# Patient Record
Sex: Male | Born: 1955
Health system: Southern US, Community
[De-identification: ages and names within clinical notes are randomized; demographics above are authoritative.]

## PROBLEM LIST (undated history)

## (undated) DIAGNOSIS — M1990 Unspecified osteoarthritis, unspecified site: Secondary | ICD-10-CM

## (undated) DIAGNOSIS — I2510 Atherosclerotic heart disease of native coronary artery without angina pectoris: Secondary | ICD-10-CM

## (undated) DIAGNOSIS — N400 Enlarged prostate without lower urinary tract symptoms: Secondary | ICD-10-CM

## (undated) DIAGNOSIS — K219 Gastro-esophageal reflux disease without esophagitis: Secondary | ICD-10-CM

## (undated) HISTORY — PX: OTHER SURGICAL HISTORY: SHX169

---

## 1998-03-29 ENCOUNTER — Inpatient Hospital Stay (HOSPITAL_COMMUNITY): Admission: RE | Admit: 1998-03-29 | Discharge: 1998-03-31 | Payer: Self-pay | Admitting: General Practice

## 2019-05-15 ENCOUNTER — Other Ambulatory Visit: Payer: Self-pay

## 2019-05-15 ENCOUNTER — Ambulatory Visit: Payer: HRSA Program | Attending: Internal Medicine

## 2019-05-15 DIAGNOSIS — Z20822 Contact with and (suspected) exposure to covid-19: Secondary | ICD-10-CM

## 2019-05-15 DIAGNOSIS — Z20828 Contact with and (suspected) exposure to other viral communicable diseases: Secondary | ICD-10-CM | POA: Diagnosis not present

## 2019-05-16 NOTE — Progress Notes (Signed)
Order(s) created erroneously. Erroneous order ID: [id number]  Order moved by: Brigitte Pulse  Order move date/time: 05/16/2019 2:32 PM  Source Patient: [id number]  Source Contact: 05/15/2019  Destination Patient: [id number]  Destination Contact: 05/15/2019

## 2019-05-16 NOTE — Progress Notes (Signed)
Moving to orders only encounter.

## 2019-05-17 LAB — NOVEL CORONAVIRUS, NAA: SARS-CoV-2, NAA: NOT DETECTED

## 2019-08-30 ENCOUNTER — Ambulatory Visit: Payer: Self-pay | Attending: Internal Medicine

## 2019-08-30 DIAGNOSIS — Z23 Encounter for immunization: Secondary | ICD-10-CM

## 2019-08-30 NOTE — Progress Notes (Signed)
   Covid-19 Vaccination Clinic  Name:  BRENNDEN THAKORE    MRN: [id number] DOB: [birth year]  08/30/2019  Mr. Redlich was observed post Covid-19 immunization for 15 minutes without incident. He was provided with Vaccine Information Sheet and instruction to access the V-Safe system.   Mr. Anello was instructed to call 911 with any severe reactions post vaccine: Marland Kitchen Difficulty breathing  . Swelling of face and throat  . A fast heartbeat  . A bad rash all over body  . Dizziness and weakness   Immunizations Administered    Name Date Dose VIS Date Route   Pfizer COVID-19 Vaccine 08/30/2019  2:21 PM 0.3 mL 05/10/2019 Intramuscular   Manufacturer: [employer], [employer]   Lot: [id number]   [place]: [id number]

## 2019-09-24 ENCOUNTER — Ambulatory Visit: Payer: Self-pay | Attending: Internal Medicine

## 2019-09-24 DIAGNOSIS — Z23 Encounter for immunization: Secondary | ICD-10-CM

## 2019-09-24 NOTE — Progress Notes (Signed)
° °  Covid-19 Vaccination Clinic  Name:  Darius Andrade    MRN: [id number] DOB: 23-Jun-1955  09/24/2019  Mr. [name] was observed post Covid-19 immunization for 15 minutes without incident. He was provided with Vaccine Information Sheet and instruction to access the V-Safe system.   Mr. [name] was instructed to call 911 with any severe reactions post vaccine:  Difficulty breathing   Swelling of face and throat   A fast heartbeat   A bad rash all over body   Dizziness and weakness   Immunizations Administered    Name Date Dose VIS Date Route   Pfizer COVID-19 Vaccine 09/24/2019  4:39 PM 0.3 mL 07/24/2018 Intramuscular   Manufacturer: [place]   Lot: [id number]   [place]: [id number]

## 2020-02-18 ENCOUNTER — Encounter (HOSPITAL_BASED_OUTPATIENT_CLINIC_OR_DEPARTMENT_OTHER): Payer: Self-pay | Admitting: *Deleted

## 2020-02-18 ENCOUNTER — Emergency Department (HOSPITAL_BASED_OUTPATIENT_CLINIC_OR_DEPARTMENT_OTHER): Payer: Self-pay

## 2020-02-18 ENCOUNTER — Other Ambulatory Visit: Payer: Self-pay

## 2020-02-18 ENCOUNTER — Emergency Department (HOSPITAL_BASED_OUTPATIENT_CLINIC_OR_DEPARTMENT_OTHER)
Admission: EM | Admit: 2020-02-18 | Discharge: 2020-02-18 | Disposition: A | Discharge status: Home / Self Care | Payer: Self-pay | Attending: Emergency Medicine | Admitting: Emergency Medicine

## 2020-02-18 DIAGNOSIS — I82401 Acute embolism and thrombosis of unspecified deep veins of right lower extremity: Secondary | ICD-10-CM | POA: Insufficient documentation

## 2020-02-18 DIAGNOSIS — I82411 Acute embolism and thrombosis of right femoral vein: Secondary | ICD-10-CM

## 2020-02-18 LAB — CBC WITH DIFFERENTIAL/PLATELET
Abs Immature Granulocytes: 0.03 10*3/uL (ref 0.00–0.07)
Basophils Absolute: 0.1 10*3/uL (ref 0.0–0.1)
Basophils Relative: 1 %
Eosinophils Absolute: 0.1 10*3/uL (ref 0.0–0.5)
Eosinophils Relative: 1 %
HCT: 43.7 % (ref 39.0–52.0)
Hemoglobin: 15 g/dL (ref 13.0–17.0)
Immature Granulocytes: 0 %
Lymphocytes Relative: 12 %
Lymphs Abs: 1.2 10*3/uL (ref 0.7–4.0)
MCH: 31.4 pg (ref 26.0–34.0)
MCHC: 34.3 g/dL (ref 30.0–36.0)
MCV: 91.4 fL (ref 80.0–100.0)
Monocytes Absolute: 0.9 10*3/uL (ref 0.1–1.0)
Monocytes Relative: 10 %
Neutro Abs: 7.2 10*3/uL (ref 1.7–7.7)
Neutrophils Relative %: 76 %
Platelets: 229 10*3/uL (ref 150–400)
RBC: 4.78 MIL/uL (ref 4.22–5.81)
RDW: 11.5 % (ref 11.5–15.5)
WBC: 9.4 10*3/uL (ref 4.0–10.5)
nRBC: 0 % (ref 0.0–0.2)

## 2020-02-18 LAB — BASIC METABOLIC PANEL
Anion gap: 9 (ref 5–15)
BUN: 13 mg/dL (ref 8–23)
CO2: 26 mmol/L (ref 22–32)
Calcium: 9.2 mg/dL (ref 8.9–10.3)
Chloride: 105 mmol/L (ref 98–111)
Creatinine, Ser: 1.05 mg/dL (ref 0.61–1.24)
GFR calc Af Amer: >60 mL/min (ref >60)
GFR calc non Af Amer: >60 mL/min (ref >60)
Glucose, Bld: 102 mg/dL — ABNORMAL HIGH (ref 70–99)
Potassium: 4.1 mmol/L (ref 3.5–5.1)
Sodium: 140 mmol/L (ref 135–145)

## 2020-02-18 MED ORDER — RIVAROXABAN (XARELTO) EDUCATION KIT FOR DVT/PE PATIENTS: PACK | Freq: Once | Status: AC

## 2020-02-18 MED ORDER — RIVAROXABAN 15 MG PO TABS: 15.0000 mg | ORAL_TABLET | Freq: Once | ORAL | Status: AC

## 2020-02-18 MED ORDER — RIVAROXABAN (XARELTO) VTE STARTER PACK (15 & 20 MG): ORAL_TABLET | ORAL | 0 refills | Status: DC

## 2020-02-18 MED ORDER — RIVAROXABAN 15 MG PO TABS
ORAL_TABLET | ORAL | Status: AC
  Administered 2020-02-18: 15 mg via ORAL
  Filled 2020-02-18: qty 1

## 2020-02-18 NOTE — Discharge Instructions (Signed)
Contact a health care provider if: You miss a dose of your blood thinner. Your menstrual period is heavier than usual. You have unusual bruising. Get help right away if: You have: New or increased pain, swelling, or redness in an arm or leg. Numbness or tingling in an arm or leg. Shortness of breath. Chest pain. A rapid or irregular heartbeat. A severe headache or confusion. A cut that will not stop bleeding. There is blood in your vomit, stool, or urine. You have a serious fall or accident, or you hit your head. You feel light-headed or dizzy. You cough up blood.

## 2020-02-18 NOTE — ED Notes (Signed)
Sent blue top to lab also, if needed

## 2020-02-18 NOTE — ED Notes (Signed)
Pt ambulatory, denies CP, denies shob

## 2020-02-18 NOTE — ED Notes (Signed)
ED Provider at bedside. 

## 2020-02-18 NOTE — ED Triage Notes (Signed)
Pain and swelling to his right lower leg x 2 weeks. It gets worse with standing on his feet at work.

## 2020-02-18 NOTE — ED Provider Notes (Signed)
[facility] Provider Note   CSN: [id number] Arrival date & time: 02/18/20  1340     History Chief Complaint  Patient presents with  . Leg Pain    Darius Andrade is a 64 y.o. male who presents emergency department chief complaint of right leg swelling. Onset 2 weeks ago. Patient states that his leg swelled spontaneously. He does stand on his legs all day and works at a [employer]. The patient states that his leg is felt heavy and at night is very achy. It seems to improve when he lies down. His right leg is the only one that is swollen. Is never had anything like this before. The patient was at work today looking at his leg when a coworker noticed how swollen his leg with and told him that he should get it checked out because it could be a blood clot and if it dislodged it could go to his lung and kill him. The patient denies any chest pain or shortness of breath. He denies soaking night sweats, fevers, unexplained weight loss. He has no history of known coagulopathies.  HPI     History reviewed. No pertinent past medical history.  There are no problems to display for this patient.   History reviewed. No pertinent surgical history.     No family history on file.  Social History   Tobacco Use  . Smoking status: Never Smoker  . Smokeless tobacco: Never Used  Substance Use Topics  . Alcohol use: Never  . Drug use: Never    Home Medications Prior to Admission medications   Medication Sig Start Date End Date Taking? Authorizing Provider  tamsulosin (FLOMAX) 0.4 MG CAPS capsule Take by mouth.   Yes [provider]    Allergies    Patient has no known allergies.  Review of Systems   Review of Systems Ten systems reviewed and are negative for acute change, except as noted in the HPI.   Physical Exam Updated Vital Signs BP (!) 141/70 (BP Location: Left Arm)   Pulse (!) 56   Temp 98.7 F (37.1 C) (Oral)   Resp 18   Ht 5\' 10"   (1.778 m)   Wt 74.4 kg   SpO2 100%   BMI 23.53 kg/m   Physical Exam Physical Exam  Nursing note and vitals reviewed. Constitutional: He appears well-developed and well-nourished. No distress.  HENT:  Head: Normocephalic and atraumatic.  Eyes: Conjunctivae normal are normal. No scleral icterus.  Neck: Normal range of motion. Neck supple.  Cardiovascular: Normal rate, regular rhythm and normal heart sounds.   Pulmonary/Chest: Effort normal and breath sounds normal. No respiratory distress.  Abdominal: Soft. There is no tenderness.  Musculoskeletal: Right leg is markedly more swollen especially in the calf on the right side. There is some petechiae on the medial side of the leg. No excoriations noted. Mild venous distention noted. Neurological: He is alert.  Skin: Skin is warm and dry. He is not diaphoretic.  Psychiatric: His behavior is normal.    ED Results / Procedures / Treatments   Labs (all labs ordered are listed, but only abnormal results are displayed) Labs Reviewed - No data to display  EKG None  Radiology US Venous Img Lower Unilateral Right  Result Date: 02/18/2020 CLINICAL DATA:  Right lower extremity swelling and pain 3 days. EXAM: RIGHT LOWER EXTREMITY VENOUS DOPPLER ULTRASOUND TECHNIQUE: Gray-scale sonography with compression, as well as color and duplex ultrasound, were performed to evaluate the  deep venous system(s) from the level of the common femoral vein through the popliteal and proximal calf veins. COMPARISON:  None. FINDINGS: VENOUS Normal compressibility of the common femoral, superficial femoral veins. There is heterogeneous echogenic material throughout the profundal femoral vein which is noncompressible and demonstrates no color flow. These changes extend into the popliteal and peroneal/posterior tibial veins below the knee. Findings are compatible with acute occlusive thrombus. Limited views of the contralateral common femoral vein are unremarkable. OTHER  None. Limitations: none IMPRESSION: Acute occlusive DVT involving the right profundal femoral vein extending into the popliteal and calf veins below the knee. These results were called by telephone at the time of interpretation on 02/18/2020 at 2:39 pm to provider [facility] , who verbally acknowledged these results. Electronically Signed   By: Darius Andrade M.D.   On: 02/18/2020 14:40    Procedures Procedures (including critical care time)  Medications Ordered in ED Medications - No data to display  ED Course  I have reviewed the triage vital signs and the nursing notes.  Pertinent labs & imaging results that were available during my care of the patient were reviewed by me and considered in my medical decision making (see chart for details).    MDM Rules/Calculators/A&P                          Is a 64 year old male here with right leg swelling. I ordered and reviewed a ultrasound of the right leg which shows an acute DVT. The patient does not have any chest pain or shortness of breath to suggest pulmonary embolus. Patient has multiple social concerns. I had a long discussion with him about the need for anticoagulation and follow-up. I have printed out the [place] care path coordinator information to help the patient follow-up on payment options and help with cost. The patient was at first reluctant to allow me to get labs due to cost however patient understands that these are necessary at this time. He does not wish to be admitted. The patient will be discharged on Xarelto. Given 15 mg here. [facility]. Given the Xarelto starter pack. I reviewed the patient's labs which show no acute abnormalities. He has normal renal function. He appears appropriate for discharge at this time with strict return precautions. Final Clinical Impression(s) / ED Diagnoses Final diagnoses:  None    Rx / DC Orders ED Discharge Orders    None       Darius Mail, PA-C 02/18/20 1908    Darius Hidden, MD 02/18/20 [phone]

## 2020-02-18 NOTE — ED Notes (Signed)
Pt ambulated to RR unassisted 

## 2020-02-18 NOTE — ED Notes (Signed)
Pt discharged to home. Discharge instructions have been discussed with patient and/or family members. Pt verbally acknowledges understanding d/c instructions, and endorses comprehension to checkout at registration before leaving.  °

## 2020-02-19 ENCOUNTER — Telehealth (HOSPITAL_BASED_OUTPATIENT_CLINIC_OR_DEPARTMENT_OTHER): Payer: Self-pay | Admitting: Emergency Medicine

## 2020-08-20 ENCOUNTER — Other Ambulatory Visit: Payer: Self-pay | Admitting: Gastroenterology

## 2020-08-20 DIAGNOSIS — K635 Polyp of colon: Secondary | ICD-10-CM

## 2020-08-20 DIAGNOSIS — Q279 Congenital malformation of peripheral vascular system, unspecified: Secondary | ICD-10-CM

## 2020-08-26 ENCOUNTER — Other Ambulatory Visit: Payer: Self-pay | Admitting: Surgery

## 2020-08-26 DIAGNOSIS — C189 Malignant neoplasm of colon, unspecified: Secondary | ICD-10-CM

## 2020-09-04 ENCOUNTER — Other Ambulatory Visit: Payer: Self-pay

## 2020-09-04 ENCOUNTER — Ambulatory Visit
Admission: RE | Admit: 2020-09-04 | Discharge: 2020-09-04 | Disposition: A | Discharge status: Home / Self Care | Payer: 59 | Source: Ambulatory Visit | Attending: Gastroenterology | Admitting: Gastroenterology

## 2020-09-04 ENCOUNTER — Ambulatory Visit
Admission: RE | Admit: 2020-09-04 | Discharge: 2020-09-04 | Disposition: A | Discharge status: Home / Self Care | Payer: 59 | Source: Ambulatory Visit | Attending: Surgery | Admitting: Surgery

## 2020-09-04 DIAGNOSIS — C189 Malignant neoplasm of colon, unspecified: Secondary | ICD-10-CM

## 2020-09-04 DIAGNOSIS — Q279 Congenital malformation of peripheral vascular system, unspecified: Secondary | ICD-10-CM

## 2020-09-04 DIAGNOSIS — K635 Polyp of colon: Secondary | ICD-10-CM

## 2020-09-04 MED ORDER — IOPAMIDOL (ISOVUE-300) INJECTION 61%
100.0000 mL | Freq: Once | INTRAVENOUS | Status: AC | PRN
  Administered 2020-09-04: 100 mL via INTRAVENOUS

## 2020-10-20 ENCOUNTER — Ambulatory Visit: Payer: Self-pay | Admitting: Surgery

## 2020-10-20 NOTE — Progress Notes (Signed)
Please place orders in epic pt. Is scheduled for preop 

## 2020-10-20 NOTE — H&P (Signed)
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 Paulita Fujita 08/07/20 which showed: Large polyp in Darius descending colon suspicious in appearance but biopsied and returned tubulovillous adenoma. Additionally, ascending colon and transverse colon polyps that were removed and returned as tubular adenomas. Darius descending colon lesion was tattooed. He was also found to have some mucosal nodule in Darius rectum and sigmoid as well as in Darius descending and transverse colon that looked like a venous bleb.  He was referred to our office for further evaluation. He is scheduled for a CT abdomen and pelvis 09/04/20. We are adding a CT chest. He denies any complaints today. He denies any abdominal pain, rectal bleeding, nausea or vomiting.  PMH: DVT, BPH  PSH: He denies any prior abdominal surgical history  FHx: Maternal Aunt with colon cancer; denies any other FHx of colorectal, breast, endometrial, ovarian or cervical cancer.  Social: Denies use of tobacco (quit 2005)/EtOH/drugs; works in Facilities [employer]  ROS: A comprehensive 10 system review of systems was completed with Darius Andrade and pertinent findings as noted above.  Darius Andrade is a 65 year old male.   Past Surgical History Darius Forehand, Andrade; 08/26/2020 3:17 PM) Colon Polyp Removal - Colonoscopy  Hemorrhoidectomy   Diagnostic Studies History Darius Forehand, Andrade; 08/26/2020 3:17 PM) Colonoscopy  within last year  Allergies Darius Forehand, Andrade; 08/26/2020 3:17 PM) No Known Drug Allergies  [08/26/2020]: Allergies Reconciled   Medication History Darius Forehand, Andrade; 08/26/2020 3:17 PM) PEG 3350-KCl-Na Bicarb-NaCl (420GM For Solution, Oral) Active. Tamsulosin HCl (0.4MG  Capsule, Oral) Active. Xarelto (20MG  Tablet, Oral) Active. Xarelto Starter Pack  (15 & 20MG  Tab Ther Pack, Oral) Active. Medications Reconciled  Social History Darius Forehand, Andrade; 08/26/2020 3:17 PM) Alcohol use  Remotely quit alcohol use. Caffeine use  Coffee. Tobacco use  Former smoker.  Family History Darius Forehand, Andrade; 08/26/2020 3:17 PM) Arthritis  Father, Mother. Diabetes Mellitus  Brother, Father. Melanoma  Brother. Prostate Cancer  Father.  Other Problems Darius Forehand, Andrade; 08/26/2020 3:17 PM) Enlarged Prostate  Gastroesophageal Reflux Disease     Review of Systems Darius Andrade; 08/26/2020 3:17 PM) General Not Present- Appetite Loss, Chills, Fatigue, Fever, Night Sweats, Weight Gain and Weight Loss. Skin Present- New Lesions. Not Present- Change in Wart/Mole, Dryness, Hives, Jaundice, Non-Healing Wounds, Rash and Ulcer. HEENT Present- Wears glasses/contact lenses. Not Present- Earache, Hearing Loss, Hoarseness, Nose Bleed, Oral Ulcers, Ringing in Darius Ears, Seasonal Allergies, Sinus Pain, Sore Throat, Visual Disturbances and Yellow Eyes. Respiratory Not Present- Bloody sputum, Chronic Cough, Difficulty Breathing, Snoring and Wheezing. Breast Not Present- Breast Mass, Breast Pain, Nipple Discharge and Skin Changes. Cardiovascular Present- Leg Cramps. Not Present- Chest Pain, Difficulty Breathing Lying Down, Palpitations, Rapid Heart Rate, Shortness of Breath and Swelling of Extremities. Gastrointestinal Present- Change in Bowel Habits, Constipation and Hemorrhoids. Not Present- Abdominal Pain, Bloating, Bloody Stool, Chronic diarrhea, Difficulty Swallowing, Excessive gas, Gets full quickly at meals, Indigestion, Nausea, Rectal Pain and Vomiting. Male Genitourinary Present- Nocturia. Not Present- Blood in Urine, Change in Urinary Stream, Frequency, Impotence, Painful Urination, Urgency and Urine Leakage. Musculoskeletal Present- Muscle Weakness. Not Present- Back Pain, Joint Pain, Joint Stiffness, Muscle Pain and Swelling of  Extremities. Neurological Present- Decreased Memory. Not Present- Fainting, Headaches, Numbness, Seizures, Tingling, Tremor, Trouble walking and Weakness. Psychiatric Present- Depression. Not Present- Anxiety, Bipolar, Change in Sleep Pattern,  Fearful and Frequent crying. Endocrine Not Present- Cold Intolerance, Excessive Hunger, Hair Changes, Heat Intolerance, Hot flashes and New Diabetes. Hematology Present- Blood Thinners and Easy Bruising. Not Present- Excessive bleeding, Gland problems, HIV and Persistent Infections.  Vitals Darius Reams Alston Andrade; 08/26/2020 3:18 PM) 08/26/2020 3:17 PM Weight: 169.25 lb Height: 65in Body Surface Area: 1.84 m Body Mass Index: 28.16 kg/m  Temp.: 97.75F  Pulse: 98 (Regular)  P.OX: 97% (Room air) BP: 126/80(Sitting, Left Arm, Standard)       Physical Exam Darius Gave M. Halimah Bewick MD; 08/26/2020 4:10 PM) Darius physical exam findings are as follows: Note: Constitutional: No acute distress; conversant; no deformities; wearing mask Eyes: Moist conjunctiva; no lid lag; anicteric sclerae; pupils equal and round Neck: Trachea midline; no palpable thyromegaly Lungs: Normal respiratory effort; no tactile fremitus CV: rrr; no palpable thrill; no pitting edema GI: Abdomen soft, nontender, nondistended; no palpable hepatosplenomegaly MSK: Normal gait; no clubbing/cyanosis Psychiatric: Appropriate affect; alert and oriented 3 Lymphatic: No palpable cervical or axillary lymphadenopathy    Assessment & Plan Darius Gave M. Que Meneely MD; 08/26/2020 4:14 PM) COLON CANCER (C18.9)  Darius Andrade is a very pleasant 45yoM with newly diagnosed polypoid lesion in descending colon, not amenable to endoscopic removal; biopsy showed TVA but endoscopic appearance was worrisome for potential malignancy; tattoo'd  CT CAP 09/07/20 - no evidence of metastatic disease  -Medical clearance and plan for anticoagulation perioperatively - would need to come off ~72hrs prior to  surgery -Darius anatomy and physiology of Darius GI tract was discussed with Darius Andrade. Darius pathophysiology of colon polyps and cancer was discussed with associated pictures. -We reviewed robotic assisted left hemicolectomy/segmental colectomy based on location of tattoo; flexible sigmoidoscopy -Darius planned procedure, material risks (including, but not limited to, pain, bleeding, infection, scarring, need for blood transfusion, damage to surrounding structures- blood vessels/nerves/viscus/organs, damage to ureter, urine leak, leak from anastomosis, need for additional procedures, worsening of pre-existing medical conditions, need for stoma which may be permanent, hernia, recurrence, DVT/PE, pneumonia, heart attack, stroke, death) benefits and alternatives to surgery were discussed at length. Darius Andrade's questions were answered to his satisfaction, he voiced understanding and elected to proceed with surgery. Additionally, we discussed typical postoperative expectations and Darius recovery process.

## 2020-10-20 NOTE — Patient Instructions (Addendum)
DUE TO COVID-19 ONLY ONE VISITOR IS ALLOWED TO COME WITH YOU AND STAY IN THE WAITING ROOM ONLY DURING PRE OP AND PROCEDURE DAY OF SURGERY.   TWO VISITORS  MAY VISIT WITH YOU AFTER SURGERY IN YOUR PRIVATE ROOM DURING VISITING HOURS ONLY!  YOU NEED TO HAVE A COVID 19 TEST ON__5-31-22_____ @_______ , THIS TEST MUST BE DONE BEFORE SURGERY,  COVID TESTING SITE [address] [place] [address], IT IS ON THE RIGHT GOING OUT [address] APPROXIMATELY  2 MINUTES PAST [employer] ON THE RIGHT. ONCE YOUR COVID TEST IS COMPLETED,  PLEASE BEGIN THE QUARANTINE INSTRUCTIONS AS OUTLINED IN YOUR HANDOUT.                Darius Andrade  10/20/2020   Your procedure is scheduled on: 10-28-20   Report to [facility] Main  Entrance   Report to admitting at       [place] AM     Call this number if you have problems the morning of surgery [phone]    Remember: Follow bowel prep per MD order follow a clear liquid dit the day of your bowel prep to prevent dehydration   DRINK 2 PRESURGERY ENSURE DRINKS THE NIGHT BEFORE SURGERY AT   1000 PM AND 1 PRESURGERY DRINK THE DAY OF THE PROCEDURE 3 HOURS PRIOR TO SCHEDULED SURGERY. NO SOLIDS AFTER MIDNIGHT   THE DAY PRIOR TO THE SURGERY. NOTHING BY MOUTH EXCEPT CLEAR LIQUIDS UNTIL THREE HOURS PRIOR TO SCHEDULED SURGERY.   PLEASE FINISH PRESURGERY ENSURE DRINK PER SURGEON ORDER 3 HOURS PRIOR TO SCHEDULED SURGERY TIME WHICH NEEDS TO BE   COMPLETED AT __0530 am then nothing by mouth_______.     CLEAR LIQUID DIET   Foods Allowed                                                                                       Foods Excluded  Black Coffee and tea, regular and decaf                                         liquids that you cannot  Plain Jell-O any favor except red or purple                                           see through such as: Fruit ices (not with fruit pulp)                                                              milk,  soups, orange juice  Iced Popsicles  All solid food                                    Cranberry, grape and apple juices Sports drinks like Gatorade Lightly seasoned clear broth or consume(fat free) Sugar, honey syrup  Sample Menu Breakfast                                Lunch                                     Supper Cranberry juice                    Beef broth                            Chicken broth Jell-O                                     Grape juice                           Apple juice Coffee or tea                        Jell-O                                      Popsicle                                                Coffee or tea                        Coffee or tea  _____________________________________________________________________     BRUSH YOUR TEETH MORNING OF SURGERY AND RINSE YOUR MOUTH OUT, NO CHEWING GUM CANDY OR MINTS.     Take these medicines the morning of surgery with A SIP OF WATER: flomax                                 You may not have any metal on your body including hair pins and              piercings  Do not wear jewelry,  lotions, powders or perfumes, deodorant                       Men may shave face and neck.   Do not bring valuables to the hospital. [place].  Contacts, dentures or bridgework may not be worn into surgery.      Patients discharged the day of surgery will not be allowed to drive home. IF YOU ARE HAVING SURGERY AND GOING HOME THE SAME DAY, YOU MUST  HAVE AN ADULT TO DRIVE YOU HOME AND BE WITH YOU FOR 24 HOURS. YOU MAY GO HOME BY TAXI OR UBER OR ORTHERWISE, BUT AN ADULT MUST ACCOMPANY YOU HOME AND STAY WITH YOU FOR 24 HOURS.  Name and phone number of your driver:  Special Instructions: N/A              Please read over the following fact sheets you were  given: _____________________________________________________________________             [facility] - Preparing for Surgery Before surgery, you can play an important role.  Because skin is not sterile, your skin needs to be as free of germs as possible.  You can reduce the number of germs on your skin by washing with CHG (chlorahexidine gluconate) soap before surgery.  CHG is an antiseptic cleaner which kills germs and bonds with the skin to continue killing germs even after washing. Please DO NOT use if you have an allergy to CHG or antibacterial soaps.  If your skin becomes reddened/irritated stop using the CHG and inform your nurse when you arrive at Short Stay. Do not shave (including legs and underarms) for at least 48 hours prior to the first CHG shower.  You may shave your face/neck. Please follow these instructions carefully:  1.  Shower with CHG Soap the night before surgery and the  morning of Surgery.  2.  If you choose to wash your hair, wash your hair first as usual with your  normal  shampoo.  3.  After you shampoo, rinse your hair and body thoroughly to remove the  shampoo.                           4.  Use CHG as you would any other liquid soap.  You can apply chg directly  to the skin and wash                       Gently with a scrungie or clean washcloth.  5.  Apply the CHG Soap to your body ONLY FROM THE NECK DOWN.   Do not use on face/ open                           Wound or open sores. Avoid contact with eyes, ears mouth and genitals (private parts).                       Wash face,  Genitals (private parts) with your normal soap.             6.  Wash thoroughly, paying special attention to the area where your surgery  will be performed.  7.  Thoroughly rinse your body with warm water from the neck down.  8.  DO NOT shower/wash with your normal soap after using and rinsing off  the CHG Soap.                9.  Pat yourself dry with a clean towel.            10.  Wear  clean pajamas.            11.  Place clean sheets on your bed the night of your first shower and do not  sleep with pets. Day of Surgery : Do not apply any lotions/deodorants the morning of surgery.  Please wear clean clothes to the hospital/surgery center.  FAILURE TO FOLLOW THESE INSTRUCTIONS MAY RESULT IN THE CANCELLATION OF YOUR SURGERY PATIENT SIGNATURE_________________________________  NURSE SIGNATURE__________________________________  ________________________________________________________________________   Darius Andrade  An incentive spirometer is a tool that can help keep your lungs clear and active. This tool measures how well you are filling your lungs with each breath. Taking long deep breaths may help reverse or decrease the chance of developing breathing (pulmonary) problems (especially infection) following:  A long period of time when you are unable to move or be active. BEFORE THE PROCEDURE   If the spirometer includes an indicator to show your best effort, your nurse or respiratory therapist will set it to a desired goal.  If possible, sit up straight or lean slightly forward. Try not to slouch.  Hold the incentive spirometer in an upright position. INSTRUCTIONS FOR USE  1. Sit on the edge of your bed if possible, or sit up as far as you can in bed or on a chair. 2. Hold the incentive spirometer in an upright position. 3. Breathe out normally. 4. Place the mouthpiece in your mouth and seal your lips tightly around it. 5. Breathe in slowly and as deeply as possible, raising the piston or the ball toward the top of the column. 6. Hold your breath for 3-5 seconds or for as long as possible. Allow the piston or ball to fall to the bottom of the column. 7. Remove the mouthpiece from your mouth and breathe out normally. 8. Rest for a few seconds and repeat Steps 1 through 7 at least 10 times every 1-2 hours when you are awake. Take your time and take a few normal  breaths between deep breaths. 9. The spirometer may include an indicator to show your best effort. Use the indicator as a goal to work toward during each repetition. 10. After each set of 10 deep breaths, practice coughing to be sure your lungs are clear. If you have an incision (the cut made at the time of surgery), support your incision when coughing by placing a pillow or rolled up towels firmly against it. Once you are able to get out of bed, walk around indoors and cough well. You may stop using the incentive spirometer when instructed by your caregiver.  RISKS AND COMPLICATIONS  Take your time so you do not get dizzy or light-headed.  If you are in pain, you may need to take or ask for pain medication before doing incentive spirometry. It is harder to take a deep breath if you are having pain. AFTER USE  Rest and breathe slowly and easily.  It can be helpful to keep track of a log of your progress. Your caregiver can provide you with a simple table to help with this. If you are using the spirometer at home, follow these instructions: [place] IF:   You are having difficultly using the spirometer.  You have trouble using the spirometer as often as instructed.  Your pain medication is not giving enough relief while using the spirometer.  You develop fever of 100.5 F (38.1 C) or higher. SEEK IMMEDIATE MEDICAL CARE IF:   You cough up bloody sputum that had not been present before.  You develop fever of 102 F (38.9 C) or greater.  You develop worsening pain at or near the incision site. MAKE SURE YOU:   Understand these instructions.  Will watch your condition.  Will get help right away if you are not  doing well or get worse. Document Released: 09/26/2006 Document Revised: 08/08/2011 Document Reviewed: 11/27/2006 [facility] Patient Information 2014 [place], [place].   ________________________________________________________________________

## 2020-10-20 NOTE — Progress Notes (Addendum)
PCP - Marda Stalker  MD Cardiologist - no  PPM/ICD -  Device Orders -  Rep Notified -   Chest x-ray - CT chest 09-07-20 EKG -  Stress Test -  ECHO -  Cardiac Cath -   Sleep Study -  CPAP -   Fasting Blood Sugar -  Checks Blood Sugar _____ times a day  Blood Thinner Instructions: Xarelto  Stop 3 days prior   Aspirin Instructions:  ERAS Protcol - PRE-SURGERY Ensure or G2-   COVID TEST- 10-27-20  Activity -- Able to walk a flight of stairs without SOB Anesthesia review: DVT  Patient denies shortness of breath, fever, cough and chest pain at PAT appointment   All instructions explained to the patient, with a verbal understanding of the material. Patient agrees to go over the instructions while at home for a better understanding. Patient also instructed to self quarantine after being tested for COVID-19. The opportunity to ask questions was provided.

## 2020-10-22 ENCOUNTER — Ambulatory Visit: Payer: Self-pay | Admitting: Surgery

## 2020-10-22 NOTE — H&P (Signed)
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 Paulita Fujita 08/07/20 which showed a large polyp in the descending colon suspicious in appearance but biopsied and returned tubulovillous adenoma. Additionally, ascending colon and transverse colon polyps that were removed and returned as tubular adenomas. The descending colon lesion was tattooed. He was also found to have some mucosal nodule in the rectum and sigmoid as well as in the descending and transverse colon that looked like a venous bleb. He was referred to our office for further evaluation. He is scheduled for a CT abdomen and pelvis 09/04/20. We are adding a CT chest. He denies any complaints today. He denies any abdominal pain, rectal bleeding, nausea or vomiting.  PMH: DVT, BPH  PSH: He denies any prior abdominal surgical history  FHx: Maternal Aunt with colon cancer; denies any other FHx of colorectal, breast, endometrial, ovarian or cervical cancer.  Social: Denies use of tobacco (quit 2005)/EtOH/drugs; works in Facilities manager  ROS: A comprehensive 10 system review of systems was completed with the patient and pertinent findings as noted above.  The patient is a 65 year old male.   Past Surgical History Darius Forehand, CNA; 08/26/2020 3:17 PM) Colon Polyp Removal - Colonoscopy  Hemorrhoidectomy   Diagnostic Studies History Darius Forehand, CNA; 08/26/2020 3:17 PM) Colonoscopy  within last year  Allergies Darius Forehand, CNA; 08/26/2020 3:17 PM) No Known Drug Allergies  [08/26/2020]: Allergies Reconciled   Medication History Darius Forehand, CNA; 08/26/2020 3:17 PM) PEG 3350-KCl-Na Bicarb-NaCl (420GM For Solution, Oral) Active. Tamsulosin HCl (0.4MG  Capsule, Oral) Active. Xarelto (20MG  Tablet, Oral) Active. Xarelto Starter Pack (15  & 20MG  Tab Ther Pack, Oral) Active. Medications Reconciled  Social History Darius Forehand, CNA; 08/26/2020 3:17 PM) Alcohol use  Remotely quit alcohol use. Caffeine use  Coffee. Tobacco use  Former smoker.  Family History Darius Forehand, CNA; 08/26/2020 3:17 PM) Arthritis  Father, Mother. Diabetes Mellitus  Brother, Father. Melanoma  Brother. Prostate Cancer  Father.  Other Problems Darius Forehand, CNA; 08/26/2020 3:17 PM) Enlarged Prostate  Gastroesophageal Reflux Disease     Review of Systems [facility] [place] CNA; 08/26/2020 3:17 PM) General Not Present- Appetite Loss, Chills, Fatigue, Fever, Night Sweats, Weight Gain and Weight Loss. Skin Present- New Lesions. Not Present- Change in Wart/Mole, Dryness, Hives, Jaundice, Non-Healing Wounds, Rash and Ulcer. HEENT Present- Wears glasses/contact lenses. Not Present- Earache, Hearing Loss, Hoarseness, Nose Bleed, Oral Ulcers, Ringing in the Ears, Seasonal Allergies, Sinus Pain, Sore Throat, Visual Disturbances and Yellow Eyes. Respiratory Not Present- Bloody sputum, Chronic Cough, Difficulty Breathing, Snoring and Wheezing. Breast Not Present- Breast Mass, Breast Pain, Nipple Discharge and Skin Changes. Cardiovascular Present- Leg Cramps. Not Present- Chest Pain, Difficulty Breathing Lying Down, Palpitations, Rapid Heart Rate, Shortness of Breath and Swelling of Extremities. Gastrointestinal Present- Change in Bowel Habits, Constipation and Hemorrhoids. Not Present- Abdominal Pain, Bloating, Bloody Stool, Chronic diarrhea, Difficulty Swallowing, Excessive gas, Gets full quickly at meals, Indigestion, Nausea, Rectal Pain and Vomiting. Male Genitourinary Present- Nocturia. Not Present- Blood in Urine, Change in Urinary Stream, Frequency, Impotence, Painful Urination, Urgency and Urine Leakage. Musculoskeletal Present- Muscle Weakness. Not Present- Back Pain, Joint Pain, Joint Stiffness, Muscle Pain and Swelling of  Extremities. Neurological Present- Decreased Memory. Not Present- Fainting, Headaches, Numbness, Seizures, Tingling, Tremor, Trouble walking and Weakness. Psychiatric Present- Depression. Not Present- Anxiety, Bipolar, Change in Sleep Pattern,  Fearful and Frequent crying. Endocrine Not Present- Cold Intolerance, Excessive Hunger, Hair Changes, Heat Intolerance, Hot flashes and New Diabetes. Hematology Present- Blood Thinners and Easy Bruising. Not Present- Excessive bleeding, Gland problems, HIV and Persistent Infections.  Vitals Adriana Reams [place] CNA; 08/26/2020 3:18 PM) 08/26/2020 3:17 PM Weight: 169.25 lb Height: 65in Body Surface Area: 1.84 m Body Mass Index: 28.16 kg/m  Temp.: 97.55F  Pulse: 98 (Regular)  P.OX: 97% (Room air) BP: 126/80(Sitting, Left Arm, Standard)       Physical Exam [place] Gave M. Jillian Pianka MD; 08/26/2020 4:10 PM) The physical exam findings are as follows: Note: Constitutional: No acute distress; conversant; no deformities; wearing mask Eyes: Moist conjunctiva; no lid lag; anicteric sclerae; pupils equal and round Neck: Trachea midline; no palpable thyromegaly Lungs: Normal respiratory effort; no tactile fremitus CV: rrr; no palpable thrill; no pitting edema GI: Abdomen soft, nontender, nondistended; no palpable hepatosplenomegaly MSK: Normal gait; no clubbing/cyanosis Psychiatric: Appropriate affect; alert and oriented 3 Lymphatic: No palpable cervical or axillary lymphadenopathy    Assessment & Plan [place]rell Gave M. Licia Harl MD; 08/26/2020 4:14 PM) COLON CANCER (C18.9) Story: Mr. Darius Andrade is a very pleasant 65yoM with newly diagnosed polypoid lesion in descending colon, not amenable to endoscopic removal; biopsy showed TVA but endoscopic appearance was worrisome for potential malignancy; tattoo'd CT Abd/P pending; we are adding CT chest as well Impression: -Medical clearance and plan for anticoagulation perioperatively - would need to come off ~72hrs  prior to surgery -The anatomy and physiology of the GI tract was discussed with the patient. The pathophysiology of colon polyps and cancer was discussed with associated pictures. -We reviewed robotic assisted left hemicolectomy/segmental colectomy based on location of tattoo; flexible sigmoidoscopy -The planned procedure, material risks (including, but not limited to, pain, bleeding, infection, scarring, need for blood transfusion, damage to surrounding structures- blood vessels/nerves/viscus/organs, damage to ureter, urine leak, leak from anastomosis, need for additional procedures, worsening of pre-existing medical conditions, need for stoma which may be permanent, hernia, recurrence, DVT/PE, pneumonia, heart attack, stroke, death) benefits and alternatives to surgery were discussed at length. The patient's questions were answered to his satisfaction, he voiced understanding and elected to proceed with surgery. Additionally, we discussed typical postoperative expectations and the recovery process.  This patient encounter took 65 minutes today to perform the following: take history, perform exam, review outside records, interpret imaging, counsel the patient on their diagnosis and document encounter, findings & plan in the EHR  Signed by Ileana Roup, MD (08/26/2020 4:15 PM)

## 2020-10-23 ENCOUNTER — Other Ambulatory Visit: Payer: Self-pay

## 2020-10-23 ENCOUNTER — Encounter (HOSPITAL_COMMUNITY): Payer: Self-pay

## 2020-10-23 ENCOUNTER — Encounter (INDEPENDENT_AMBULATORY_CARE_PROVIDER_SITE_OTHER): Payer: Self-pay

## 2020-10-23 ENCOUNTER — Encounter (HOSPITAL_COMMUNITY)
Admission: RE | Admit: 2020-10-23 | Discharge: 2020-10-23 | Disposition: A | Discharge status: Home / Self Care | Payer: 59 | Source: Ambulatory Visit | Attending: Surgery | Admitting: Surgery

## 2020-10-23 DIAGNOSIS — Z01812 Encounter for preprocedural laboratory examination: Secondary | ICD-10-CM | POA: Diagnosis present

## 2020-10-23 HISTORY — DX: Benign prostatic hyperplasia without lower urinary tract symptoms: N40.0

## 2020-10-23 HISTORY — DX: Atherosclerotic heart disease of native coronary artery without angina pectoris: I25.10

## 2020-10-23 HISTORY — DX: Unspecified osteoarthritis, unspecified site: M19.90

## 2020-10-23 HISTORY — DX: Gastro-esophageal reflux disease without esophagitis: K21.9

## 2020-10-23 LAB — BASIC METABOLIC PANEL
Anion gap: 9 (ref 5–15)
BUN: 19 mg/dL (ref 8–23)
CO2: 22 mmol/L (ref 22–32)
Calcium: 9.2 mg/dL (ref 8.9–10.3)
Chloride: 107 mmol/L (ref 98–111)
Creatinine, Ser: 1.2 mg/dL (ref 0.61–1.24)
GFR, Estimated: >60 mL/min (ref >60)
Glucose, Bld: 105 mg/dL — ABNORMAL HIGH (ref 70–99)
Potassium: 4.1 mmol/L (ref 3.5–5.1)
Sodium: 138 mmol/L (ref 135–145)

## 2020-10-23 LAB — CBC
HCT: 42.2 % (ref 39.0–52.0)
Hemoglobin: 14.9 g/dL (ref 13.0–17.0)
MCH: 32 pg (ref 26.0–34.0)
MCHC: 35.3 g/dL (ref 30.0–36.0)
MCV: 90.8 fL (ref 80.0–100.0)
Platelets: 225 10*3/uL (ref 150–400)
RBC: 4.65 MIL/uL (ref 4.22–5.81)
RDW: 11.9 % (ref 11.5–15.5)
WBC: 6.1 10*3/uL (ref 4.0–10.5)
nRBC: 0 % (ref 0.0–0.2)

## 2020-10-24 LAB — HEMOGLOBIN A1C
Hgb A1c MFr Bld: 5.6 % (ref 4.8–5.6)
Mean Plasma Glucose: 114 mg/dL

## 2020-10-27 ENCOUNTER — Other Ambulatory Visit (HOSPITAL_COMMUNITY)
Admission: RE | Admit: 2020-10-27 | Discharge: 2020-10-27 | Disposition: A | Discharge status: Home / Self Care | Payer: 59 | Source: Ambulatory Visit | Attending: Surgery | Admitting: Surgery

## 2020-10-27 DIAGNOSIS — Z01812 Encounter for preprocedural laboratory examination: Secondary | ICD-10-CM | POA: Insufficient documentation

## 2020-10-27 DIAGNOSIS — Z20822 Contact with and (suspected) exposure to covid-19: Secondary | ICD-10-CM | POA: Insufficient documentation

## 2020-10-27 LAB — SARS CORONAVIRUS 2 (TAT 6-24 HRS): SARS Coronavirus 2: NEGATIVE

## 2020-10-27 MED ORDER — BUPIVACAINE LIPOSOME 1.3 % IJ SUSP
20.0000 mL | Freq: Once | INTRAMUSCULAR | Status: DC
  Filled 2020-10-27: qty 20

## 2020-10-27 NOTE — Anesthesia Preprocedure Evaluation (Deleted)
Anesthesia Evaluation  Patient identified by MRN, date of birth, ID band Patient awake    Reviewed: Allergy & Precautions, NPO status , Patient's Chart, lab work & pertinent test results  Airway        Dental   Pulmonary neg pulmonary ROS, former smoker,           Cardiovascular + DVT       Neuro/Psych negative neurological ROS  negative psych ROS   GI/Hepatic Neg liver ROS, GERD  ,  Endo/Other  negative endocrine ROS  Renal/GU negative Renal ROS  negative genitourinary   Musculoskeletal negative musculoskeletal ROS (+)   Abdominal   Peds negative pediatric ROS (+)  Hematology negative hematology ROS (+)   Anesthesia Other Findings   Reproductive/Obstetrics negative OB ROS                             Anesthesia Physical Anesthesia Plan  ASA: II  Anesthesia Plan: General   Post-op Pain Management:    Induction: Intravenous  PONV Risk Score and Plan: 2 and Ondansetron, Dexamethasone and Treatment may vary due to age or medical condition  Airway Management Planned: Oral ETT  Additional Equipment:   Intra-op Plan:   Post-operative Plan: Extubation in OR  Informed Consent: I have reviewed the patients History and Physical, chart, labs and discussed the procedure including the risks, benefits and alternatives for the proposed anesthesia with the patient or authorized representative who has indicated his/her understanding and acceptance.     Dental advisory given  Plan Discussed with: CRNA and Surgeon  Anesthesia Plan Comments:         Anesthesia Quick Evaluation

## 2020-10-28 ENCOUNTER — Other Ambulatory Visit: Payer: Self-pay

## 2020-10-28 ENCOUNTER — Inpatient Hospital Stay (HOSPITAL_COMMUNITY)
Admission: RE | Admit: 2020-10-28 | Discharge: 2020-11-01 | DRG: 331 | Disposition: A | Discharge status: Home / Self Care | Payer: 59 | Attending: Surgery | Admitting: Surgery

## 2020-10-28 ENCOUNTER — Encounter (HOSPITAL_COMMUNITY)
Admission: RE | Disposition: A | Discharge status: Home / Self Care | Payer: Self-pay | Source: Home / Self Care | Attending: Surgery

## 2020-10-28 ENCOUNTER — Encounter (HOSPITAL_COMMUNITY): Payer: Self-pay | Admitting: Surgery

## 2020-10-28 ENCOUNTER — Inpatient Hospital Stay (HOSPITAL_COMMUNITY): Payer: 59 | Admitting: Physician Assistant

## 2020-10-28 ENCOUNTER — Inpatient Hospital Stay (HOSPITAL_COMMUNITY): Payer: 59 | Admitting: Anesthesiology

## 2020-10-28 DIAGNOSIS — Z9049 Acquired absence of other specified parts of digestive tract: Secondary | ICD-10-CM

## 2020-10-28 DIAGNOSIS — D124 Benign neoplasm of descending colon: Secondary | ICD-10-CM | POA: Diagnosis present

## 2020-10-28 DIAGNOSIS — D125 Benign neoplasm of sigmoid colon: Principal | ICD-10-CM | POA: Diagnosis present

## 2020-10-28 DIAGNOSIS — I2510 Atherosclerotic heart disease of native coronary artery without angina pectoris: Secondary | ICD-10-CM | POA: Diagnosis present

## 2020-10-28 DIAGNOSIS — Z86718 Personal history of other venous thrombosis and embolism: Secondary | ICD-10-CM | POA: Diagnosis not present

## 2020-10-28 DIAGNOSIS — Z79899 Other long term (current) drug therapy: Secondary | ICD-10-CM | POA: Diagnosis not present

## 2020-10-28 DIAGNOSIS — Z20822 Contact with and (suspected) exposure to covid-19: Secondary | ICD-10-CM | POA: Diagnosis present

## 2020-10-28 DIAGNOSIS — K219 Gastro-esophageal reflux disease without esophagitis: Secondary | ICD-10-CM | POA: Diagnosis present

## 2020-10-28 DIAGNOSIS — Z800 Family history of malignant neoplasm of digestive organs: Secondary | ICD-10-CM

## 2020-10-28 DIAGNOSIS — Z7901 Long term (current) use of anticoagulants: Secondary | ICD-10-CM

## 2020-10-28 DIAGNOSIS — N400 Enlarged prostate without lower urinary tract symptoms: Secondary | ICD-10-CM | POA: Diagnosis present

## 2020-10-28 DIAGNOSIS — Z87891 Personal history of nicotine dependence: Secondary | ICD-10-CM

## 2020-10-28 LAB — TYPE AND SCREEN
ABO/RH(D): B POS
Antibody Screen: NEGATIVE

## 2020-10-28 LAB — ABO/RH: ABO/RH(D): B POS

## 2020-10-28 SURGERY — COLECTOMY, RIGHT, ROBOT-ASSISTED
Anesthesia: General | Site: Abdomen

## 2020-10-28 MED ORDER — ENSURE PRE-SURGERY PO LIQD: 592.0000 mL | Freq: Once | ORAL | Status: DC

## 2020-10-28 MED ORDER — BUPIVACAINE LIPOSOME 1.3 % IJ SUSP
INTRAMUSCULAR | Status: DC | PRN
Start: 2020-10-28 — End: 2020-10-28
  Administered 2020-10-28: 20 mL

## 2020-10-28 MED ORDER — PROMETHAZINE HCL 25 MG/ML IJ SOLN: 6.2500 mg | INTRAMUSCULAR | Status: DC | PRN

## 2020-10-28 MED ORDER — ONDANSETRON HCL 4 MG/2ML IJ SOLN
4.0000 mg | Freq: Four times a day (QID) | INTRAMUSCULAR | Status: DC | PRN
  Administered 2020-10-30 – 2020-10-31 (×5): 4 mg via INTRAVENOUS
  Filled 2020-10-28 (×5): qty 2

## 2020-10-28 MED ORDER — EPHEDRINE 5 MG/ML INJ
INTRAVENOUS | Status: AC
  Filled 2020-10-28: qty 10

## 2020-10-28 MED ORDER — BISACODYL 5 MG PO TBEC: 20.0000 mg | DELAYED_RELEASE_TABLET | Freq: Once | ORAL | Status: DC

## 2020-10-28 MED ORDER — PROPOFOL 10 MG/ML IV BOLUS
INTRAVENOUS | Status: DC | PRN
  Administered 2020-10-28: 150 mg via INTRAVENOUS

## 2020-10-28 MED ORDER — KETAMINE HCL 10 MG/ML IJ SOLN
INTRAMUSCULAR | Status: AC
  Filled 2020-10-28: qty 1

## 2020-10-28 MED ORDER — PROPOFOL 10 MG/ML IV BOLUS
INTRAVENOUS | Status: AC
  Filled 2020-10-28: qty 20

## 2020-10-28 MED ORDER — DIPHENHYDRAMINE HCL 12.5 MG/5ML PO ELIX
12.5000 mg | ORAL_SOLUTION | Freq: Four times a day (QID) | ORAL | Status: DC | PRN
  Administered 2020-10-28: 12.5 mg via ORAL
  Filled 2020-10-28: qty 5

## 2020-10-28 MED ORDER — POLYETHYLENE GLYCOL 3350 17 GM/SCOOP PO POWD: 1.0000 | Freq: Once | ORAL | Status: DC

## 2020-10-28 MED ORDER — SUGAMMADEX SODIUM 200 MG/2ML IV SOLN
INTRAVENOUS | Status: DC | PRN
  Administered 2020-10-28: 150 mg via INTRAVENOUS

## 2020-10-28 MED ORDER — SODIUM CHLORIDE 0.9 % IV SOLN
2.0000 g | INTRAVENOUS | Status: AC
  Administered 2020-10-28: 2 g via INTRAVENOUS
  Filled 2020-10-28: qty 2

## 2020-10-28 MED ORDER — ALVIMOPAN 12 MG PO CAPS
12.0000 mg | ORAL_CAPSULE | Freq: Two times a day (BID) | ORAL | Status: DC
  Administered 2020-10-29 – 2020-10-31 (×5): 12 mg via ORAL
  Filled 2020-10-28 (×6): qty 1

## 2020-10-28 MED ORDER — SIMETHICONE 80 MG PO CHEW
40.0000 mg | CHEWABLE_TABLET | Freq: Four times a day (QID) | ORAL | Status: DC | PRN
  Administered 2020-10-30: 40 mg via ORAL
  Filled 2020-10-28: qty 1

## 2020-10-28 MED ORDER — CHLORHEXIDINE GLUCONATE 0.12 % MT SOLN
15.0000 mL | Freq: Once | OROMUCOSAL | Status: AC
  Administered 2020-10-28: 15 mL via OROMUCOSAL

## 2020-10-28 MED ORDER — HYDRALAZINE HCL 20 MG/ML IJ SOLN
10.0000 mg | INTRAMUSCULAR | Status: DC | PRN
Start: 2020-10-28 — End: 2020-11-01

## 2020-10-28 MED ORDER — MIDAZOLAM HCL 5 MG/5ML IJ SOLN
INTRAMUSCULAR | Status: DC | PRN
  Administered 2020-10-28: 2 mg via INTRAVENOUS

## 2020-10-28 MED ORDER — ONDANSETRON HCL 4 MG PO TABS: 4.0000 mg | ORAL_TABLET | Freq: Four times a day (QID) | ORAL | Status: DC | PRN

## 2020-10-28 MED ORDER — LACTATED RINGERS IR SOLN
Status: DC | PRN
  Administered 2020-10-28: 1000 mL

## 2020-10-28 MED ORDER — ACETAMINOPHEN 500 MG PO TABS
1000.0000 mg | ORAL_TABLET | Freq: Four times a day (QID) | ORAL | Status: DC
  Administered 2020-10-28 – 2020-10-29 (×4): 1000 mg via ORAL
  Filled 2020-10-28 (×6): qty 2

## 2020-10-28 MED ORDER — PROMETHAZINE HCL 25 MG/ML IJ SOLN
INTRAMUSCULAR | Status: AC
  Filled 2020-10-28: qty 1

## 2020-10-28 MED ORDER — 0.9 % SODIUM CHLORIDE (POUR BTL) OPTIME
TOPICAL | Status: DC | PRN
  Administered 2020-10-28: 2000 mL

## 2020-10-28 MED ORDER — PHENYLEPHRINE 40 MCG/ML (10ML) SYRINGE FOR IV PUSH (FOR BLOOD PRESSURE SUPPORT)
PREFILLED_SYRINGE | INTRAVENOUS | Status: AC
  Filled 2020-10-28: qty 10

## 2020-10-28 MED ORDER — ORAL CARE MOUTH RINSE: 15.0000 mL | Freq: Once | OROMUCOSAL | Status: AC

## 2020-10-28 MED ORDER — ENSURE PRE-SURGERY PO LIQD: 296.0000 mL | Freq: Once | ORAL | Status: DC

## 2020-10-28 MED ORDER — ENSURE SURGERY PO LIQD
237.0000 mL | Freq: Two times a day (BID) | ORAL | Status: DC
  Administered 2020-10-29 – 2020-10-31 (×4): 237 mL via ORAL

## 2020-10-28 MED ORDER — ACETAMINOPHEN 500 MG PO TABS
1000.0000 mg | ORAL_TABLET | ORAL | Status: AC
  Administered 2020-10-28: 1000 mg via ORAL
  Filled 2020-10-28: qty 2

## 2020-10-28 MED ORDER — DIPHENHYDRAMINE HCL 50 MG/ML IJ SOLN: 12.5000 mg | Freq: Four times a day (QID) | INTRAMUSCULAR | Status: DC | PRN

## 2020-10-28 MED ORDER — MIDAZOLAM HCL 2 MG/2ML IJ SOLN
INTRAMUSCULAR | Status: AC
  Filled 2020-10-28: qty 2

## 2020-10-28 MED ORDER — HEPARIN SODIUM (PORCINE) 5000 UNIT/ML IJ SOLN
5000.0000 [IU] | Freq: Three times a day (TID) | INTRAMUSCULAR | Status: DC
  Administered 2020-10-28 – 2020-10-30 (×5): 5000 [IU] via SUBCUTANEOUS
  Filled 2020-10-28 (×5): qty 1

## 2020-10-28 MED ORDER — TRAMADOL HCL 50 MG PO TABS
50.0000 mg | ORAL_TABLET | Freq: Four times a day (QID) | ORAL | Status: DC | PRN
Start: 2020-10-28 — End: 2020-11-01
  Administered 2020-10-28 – 2020-10-29 (×3): 50 mg via ORAL
  Filled 2020-10-28 (×2): qty 1

## 2020-10-28 MED ORDER — BUPIVACAINE-EPINEPHRINE (PF) 0.25% -1:200000 IJ SOLN
INTRAMUSCULAR | Status: AC
  Filled 2020-10-28: qty 30

## 2020-10-28 MED ORDER — FENTANYL CITRATE (PF) 250 MCG/5ML IJ SOLN
INTRAMUSCULAR | Status: AC
  Filled 2020-10-28: qty 5

## 2020-10-28 MED ORDER — CHLORHEXIDINE GLUCONATE CLOTH 2 % EX PADS: 6.0000 | MEDICATED_PAD | Freq: Once | CUTANEOUS | Status: DC

## 2020-10-28 MED ORDER — TAMSULOSIN HCL 0.4 MG PO CAPS
0.4000 mg | ORAL_CAPSULE | Freq: Every day | ORAL | Status: DC
  Administered 2020-10-29 – 2020-11-01 (×4): 0.4 mg via ORAL
  Filled 2020-10-28 (×4): qty 1

## 2020-10-28 MED ORDER — ALUM & MAG HYDROXIDE-SIMETH 200-200-20 MG/5ML PO SUSP
30.0000 mL | Freq: Four times a day (QID) | ORAL | Status: DC | PRN
  Administered 2020-10-29 – 2020-10-30 (×2): 30 mL via ORAL
  Filled 2020-10-28 (×2): qty 30

## 2020-10-28 MED ORDER — IBUPROFEN 400 MG PO TABS: 600.0000 mg | ORAL_TABLET | Freq: Four times a day (QID) | ORAL | Status: DC | PRN

## 2020-10-28 MED ORDER — HEPARIN SODIUM (PORCINE) 5000 UNIT/ML IJ SOLN
5000.0000 [IU] | Freq: Once | INTRAMUSCULAR | Status: AC
  Administered 2020-10-28: 5000 [IU] via SUBCUTANEOUS
  Filled 2020-10-28: qty 1

## 2020-10-28 MED ORDER — LACTATED RINGERS IV SOLN: INTRAVENOUS | Status: DC

## 2020-10-28 MED ORDER — DEXAMETHASONE SODIUM PHOSPHATE 10 MG/ML IJ SOLN
INTRAMUSCULAR | Status: DC | PRN
  Administered 2020-10-28: 5 mg via INTRAVENOUS

## 2020-10-28 MED ORDER — EPHEDRINE SULFATE-NACL 50-0.9 MG/10ML-% IV SOSY
PREFILLED_SYRINGE | INTRAVENOUS | Status: DC | PRN
  Administered 2020-10-28 (×2): 10 mg via INTRAVENOUS

## 2020-10-28 MED ORDER — KETAMINE HCL 10 MG/ML IJ SOLN
INTRAMUSCULAR | Status: DC | PRN
  Administered 2020-10-28: 30 mg via INTRAVENOUS

## 2020-10-28 MED ORDER — HYDROMORPHONE HCL 1 MG/ML IJ SOLN
0.5000 mg | INTRAMUSCULAR | Status: DC | PRN
  Administered 2020-10-28 – 2020-10-30 (×5): 0.5 mg via INTRAVENOUS
  Filled 2020-10-28 (×5): qty 0.5

## 2020-10-28 MED ORDER — FENTANYL CITRATE (PF) 100 MCG/2ML IJ SOLN
INTRAMUSCULAR | Status: DC | PRN
  Administered 2020-10-28 (×3): 50 ug via INTRAVENOUS
  Administered 2020-10-28: 100 ug via INTRAVENOUS

## 2020-10-28 MED ORDER — ROCURONIUM BROMIDE 10 MG/ML (PF) SYRINGE
PREFILLED_SYRINGE | INTRAVENOUS | Status: DC | PRN
  Administered 2020-10-28: 70 mg via INTRAVENOUS
  Administered 2020-10-28 (×2): 30 mg via INTRAVENOUS
  Administered 2020-10-28: 20 mg via INTRAVENOUS

## 2020-10-28 MED ORDER — PHENYLEPHRINE 40 MCG/ML (10ML) SYRINGE FOR IV PUSH (FOR BLOOD PRESSURE SUPPORT)
PREFILLED_SYRINGE | INTRAVENOUS | Status: DC | PRN
  Administered 2020-10-28: 160 ug via INTRAVENOUS
  Administered 2020-10-28 (×2): 80 ug via INTRAVENOUS
  Administered 2020-10-28: 120 ug via INTRAVENOUS

## 2020-10-28 MED ORDER — ALVIMOPAN 12 MG PO CAPS
12.0000 mg | ORAL_CAPSULE | ORAL | Status: AC
  Administered 2020-10-28: 12 mg via ORAL
  Filled 2020-10-28: qty 1

## 2020-10-28 MED ORDER — ONDANSETRON HCL 4 MG/2ML IJ SOLN
INTRAMUSCULAR | Status: DC | PRN
  Administered 2020-10-28: 4 mg via INTRAVENOUS

## 2020-10-28 MED ORDER — ONDANSETRON HCL 4 MG/2ML IJ SOLN
INTRAMUSCULAR | Status: AC
  Filled 2020-10-28: qty 2

## 2020-10-28 MED ORDER — INDOCYANINE GREEN 25 MG IV SOLR
INTRAVENOUS | Status: DC | PRN
  Administered 2020-10-28: 12 mg via INTRAVENOUS

## 2020-10-28 MED ORDER — FENTANYL CITRATE (PF) 100 MCG/2ML IJ SOLN: 25.0000 ug | INTRAMUSCULAR | Status: DC | PRN

## 2020-10-28 MED ORDER — LIDOCAINE 2% (20 MG/ML) 5 ML SYRINGE
INTRAMUSCULAR | Status: DC | PRN
  Administered 2020-10-28: 60 mg via INTRAVENOUS

## 2020-10-28 MED ORDER — ROCURONIUM BROMIDE 10 MG/ML (PF) SYRINGE
PREFILLED_SYRINGE | INTRAVENOUS | Status: AC
  Filled 2020-10-28: qty 20

## 2020-10-28 MED ORDER — LIDOCAINE 2% (20 MG/ML) 5 ML SYRINGE
INTRAMUSCULAR | Status: DC | PRN
  Administered 2020-10-28: 1.5 mg/kg/h via INTRAVENOUS

## 2020-10-28 MED ORDER — BUPIVACAINE-EPINEPHRINE (PF) 0.25% -1:200000 IJ SOLN
INTRAMUSCULAR | Status: DC | PRN
  Administered 2020-10-28: 30 mL

## 2020-10-28 SURGICAL SUPPLY — 124 items
ADH SKN CLS APL DERMABOND .7 (GAUZE/BANDAGES/DRESSINGS) ×2
APL PRP STRL LF DISP 70% ISPRP (MISCELLANEOUS) ×2
APPLIER CLIP 5 13 M/L LIGAMAX5 (MISCELLANEOUS)
APPLIER CLIP ROT 10 11.4 M/L (STAPLE)
APR CLP MED LRG 11.4X10 (STAPLE)
APR CLP MED LRG 5 ANG JAW (MISCELLANEOUS)
BLADE EXTENDED COATED 6.5IN (ELECTRODE) ×3 IMPLANT
CANNULA REDUC XI 12-8 STAPL (CANNULA) ×3
CANNULA REDUCER 12-8 DVNC XI (CANNULA) ×2 IMPLANT
CELLS DAT CNTRL 66122 CELL SVR (MISCELLANEOUS) IMPLANT
CHLORAPREP W/TINT 26 (MISCELLANEOUS) ×3 IMPLANT
CLIP APPLIE 5 13 M/L LIGAMAX5 (MISCELLANEOUS) IMPLANT
CLIP APPLIE ROT 10 11.4 M/L (STAPLE) IMPLANT
CLIP VESOLOCK LG 6/CT PURPLE (CLIP) IMPLANT
CLIP VESOLOCK MED LG 6/CT (CLIP) IMPLANT
COVER SURGICAL LIGHT HANDLE (MISCELLANEOUS) ×6 IMPLANT
COVER TIP SHEARS 8 DVNC (MISCELLANEOUS) ×2 IMPLANT
COVER TIP SHEARS 8MM DA VINCI (MISCELLANEOUS) ×3
COVER WAND RF STERILE (DRAPES) IMPLANT
DECANTER SPIKE VIAL GLASS SM (MISCELLANEOUS) ×3 IMPLANT
DERMABOND ADVANCED (GAUZE/BANDAGES/DRESSINGS) ×1
DERMABOND ADVANCED .7 DNX12 (GAUZE/BANDAGES/DRESSINGS) ×1 IMPLANT
DEVICE TROCAR PUNCTURE CLOSURE (ENDOMECHANICALS) IMPLANT
DRAIN CHANNEL 19F RND (DRAIN) ×3 IMPLANT
DRAPE ARM DVNC X/XI (DISPOSABLE) ×8 IMPLANT
DRAPE COLUMN DVNC XI (DISPOSABLE) ×2 IMPLANT
DRAPE DA VINCI XI ARM (DISPOSABLE) ×12
DRAPE DA VINCI XI COLUMN (DISPOSABLE) ×3
DRAPE SURG IRRIG POUCH 19X23 (DRAPES) ×3 IMPLANT
DRSG OPSITE POSTOP 4X10 (GAUZE/BANDAGES/DRESSINGS) IMPLANT
DRSG OPSITE POSTOP 4X6 (GAUZE/BANDAGES/DRESSINGS) ×2 IMPLANT
DRSG OPSITE POSTOP 4X8 (GAUZE/BANDAGES/DRESSINGS) IMPLANT
DRSG TEGADERM 2-3/8X2-3/4 SM (GAUZE/BANDAGES/DRESSINGS) ×15 IMPLANT
DRSG TEGADERM 4X4.75 (GAUZE/BANDAGES/DRESSINGS) ×3 IMPLANT
ELECT REM PT RETURN 15FT ADLT (MISCELLANEOUS) ×3 IMPLANT
ENDOLOOP SUT PDS II  0 18 (SUTURE)
ENDOLOOP SUT PDS II 0 18 (SUTURE) IMPLANT
EVACUATOR SILICONE 100CC (DRAIN) ×3 IMPLANT
GAUZE SPONGE 2X2 8PLY STRL LF (GAUZE/BANDAGES/DRESSINGS) ×2 IMPLANT
GAUZE SPONGE 4X4 12PLY STRL (GAUZE/BANDAGES/DRESSINGS) IMPLANT
GLOVE SURG ENC MOIS LTX SZ7.5 (GLOVE) ×9 IMPLANT
GLOVE SURG UNDER LTX SZ8 (GLOVE) ×9 IMPLANT
GOWN STRL REUS W/TWL XL LVL3 (GOWN DISPOSABLE) ×15 IMPLANT
GRASPER SUT TROCAR 14GX15 (MISCELLANEOUS) IMPLANT
HOLDER FOLEY CATH W/STRAP (MISCELLANEOUS) ×3 IMPLANT
KIT PROCEDURE DA VINCI SI (MISCELLANEOUS)
KIT PROCEDURE DVNC SI (MISCELLANEOUS) IMPLANT
KIT TURNOVER KIT A (KITS) ×3 IMPLANT
LIGASURE IMPACT 36 18CM CVD LR (INSTRUMENTS) ×2 IMPLANT
NDL INSUFFLATION 14GA 120MM (NEEDLE) ×1 IMPLANT
NEEDLE INSUFFLATION 14GA 120MM (NEEDLE) ×3 IMPLANT
PACK CARDIOVASCULAR III (CUSTOM PROCEDURE TRAY) ×3 IMPLANT
PACK COLON (CUSTOM PROCEDURE TRAY) ×3 IMPLANT
PAD POSITIONING PINK XL (MISCELLANEOUS) ×3 IMPLANT
PENCIL SMOKE EVACUATOR (MISCELLANEOUS) IMPLANT
PORT LAP GEL ALEXIS MED 5-9CM (MISCELLANEOUS) ×3 IMPLANT
PROTECTOR NERVE ULNAR (MISCELLANEOUS) ×6 IMPLANT
RELOAD PROXIMATE 75MM BLUE (ENDOMECHANICALS) ×6 IMPLANT
RELOAD STAPLE 45 3.5 BLU DVNC (STAPLE) IMPLANT
RELOAD STAPLE 45 4.3 GRN DVNC (STAPLE) IMPLANT
RELOAD STAPLE 60 3.5 BLU DVNC (STAPLE) IMPLANT
RELOAD STAPLE 60 4.3 GRN DVNC (STAPLE) IMPLANT
RELOAD STAPLE 75 3.8 BLU REG (ENDOMECHANICALS) IMPLANT
RELOAD STAPLER 3.5X45 BLU DVNC (STAPLE) IMPLANT
RELOAD STAPLER 3.5X60 BLU DVNC (STAPLE) IMPLANT
RELOAD STAPLER 4.3X45 GRN DVNC (STAPLE) IMPLANT
RELOAD STAPLER 4.3X60 GRN DVNC (STAPLE) IMPLANT
RETRACTOR WND ALEXIS 18 MED (MISCELLANEOUS) IMPLANT
RTRCTR WOUND ALEXIS 18CM MED (MISCELLANEOUS)
SCISSORS LAP 5X35 DISP (ENDOMECHANICALS) IMPLANT
SEAL CANN UNIV 5-8 DVNC XI (MISCELLANEOUS) ×8 IMPLANT
SEAL XI 5MM-8MM UNIVERSAL (MISCELLANEOUS) ×12
SEALER VESSEL DA VINCI XI (MISCELLANEOUS) ×3
SEALER VESSEL EXT DVNC XI (MISCELLANEOUS) ×2 IMPLANT
SET IRRIG TUBING LAPAROSCOPIC (IRRIGATION / IRRIGATOR) ×3 IMPLANT
SLEEVE ADV FIXATION 5X100MM (TROCAR) IMPLANT
SOLUTION ELECTROLUBE (MISCELLANEOUS) ×3 IMPLANT
SPONGE GAUZE 2X2 STER 10/PKG (GAUZE/BANDAGES/DRESSINGS) ×1
STAPLER 90 3.5 STAND SLIM (STAPLE) ×3
STAPLER 90 3.5 STD SLIM (STAPLE) ×1 IMPLANT
STAPLER CANNULA SEAL DVNC XI (STAPLE) ×2 IMPLANT
STAPLER CANNULA SEAL XI (STAPLE) ×3
STAPLER ECHELON POWER CIR 29 (STAPLE) IMPLANT
STAPLER ECHELON POWER CIR 31 (STAPLE) IMPLANT
STAPLER PROXIMATE 75MM BLUE (STAPLE) ×2 IMPLANT
STAPLER RELOAD 3.5X45 BLU DVNC (STAPLE)
STAPLER RELOAD 3.5X45 BLUE (STAPLE)
STAPLER RELOAD 3.5X60 BLU DVNC (STAPLE)
STAPLER RELOAD 3.5X60 BLUE (STAPLE)
STAPLER RELOAD 4.3X45 GREEN (STAPLE)
STAPLER RELOAD 4.3X45 GRN DVNC (STAPLE)
STAPLER RELOAD 4.3X60 GREEN (STAPLE)
STAPLER RELOAD 4.3X60 GRN DVNC (STAPLE)
STAPLER SHEATH (SHEATH) ×3
STAPLER SHEATH ENDOWRIST DVNC (SHEATH) ×2 IMPLANT
STOPCOCK 4 WAY LG BORE MALE ST (IV SETS) ×6 IMPLANT
SURGILUBE 2OZ TUBE FLIPTOP (MISCELLANEOUS) ×3 IMPLANT
SUT MNCRL AB 4-0 PS2 18 (SUTURE) ×3 IMPLANT
SUT PDS AB 1 CT1 27 (SUTURE) IMPLANT
SUT PDS AB 1 TP1 96 (SUTURE) IMPLANT
SUT PROLENE 0 CT 2 (SUTURE) IMPLANT
SUT PROLENE 2 0 KS (SUTURE) ×3 IMPLANT
SUT PROLENE 2 0 SH DA (SUTURE) IMPLANT
SUT SILK 2 0 (SUTURE)
SUT SILK 2 0 SH CR/8 (SUTURE) IMPLANT
SUT SILK 2-0 18XBRD TIE 12 (SUTURE) IMPLANT
SUT SILK 3 0 (SUTURE) ×3
SUT SILK 3 0 SH CR/8 (SUTURE) ×3 IMPLANT
SUT SILK 3-0 18XBRD TIE 12 (SUTURE) ×2 IMPLANT
SUT V-LOC BARB 180 2/0GR6 GS22 (SUTURE) ×6
SUT VIC AB 3-0 SH 18 (SUTURE) IMPLANT
SUT VIC AB 3-0 SH 27 (SUTURE)
SUT VIC AB 3-0 SH 27XBRD (SUTURE) IMPLANT
SUT VICRYL 0 UR6 27IN ABS (SUTURE) ×3 IMPLANT
SUTURE V-LC BRB 180 2/0GR6GS22 (SUTURE) ×2 IMPLANT
SYR 10ML LL (SYRINGE) ×3 IMPLANT
SYS LAPSCP GELPORT 120MM (MISCELLANEOUS)
SYSTEM LAPSCP GELPORT 120MM (MISCELLANEOUS) IMPLANT
TAPE UMBILICAL 1/8 X36 TWILL (MISCELLANEOUS) ×3 IMPLANT
TOWEL OR NON WOVEN STRL DISP B (DISPOSABLE) ×3 IMPLANT
TRAY FOLEY MTR SLVR 16FR STAT (SET/KITS/TRAYS/PACK) ×3 IMPLANT
TROCAR ADV FIXATION 5X100MM (TROCAR) ×3 IMPLANT
TUBING CONNECTING 10 (TUBING) ×6 IMPLANT
TUBING INSUFFLATION 10FT LAP (TUBING) ×3 IMPLANT

## 2020-10-28 NOTE — Transfer of Care (Signed)
Immediate Anesthesia Transfer of Care Note  Patient: Darius Andrade  Procedure(s) Performed: ROBOTIC ASSISTED SEGMENTAL COLECTOMY, INTRAOP ASSESSMENT OF TISSUE  PERFUSION, TAP BLOCK (N/A Abdomen)  Patient Location: PACU  Anesthesia Type:General  Level of Consciousness: sedated, patient cooperative and responds to stimulation  Airway & Oxygen Therapy: Patient Spontanous Breathing and Patient connected to face mask oxygen  Post-op Assessment: Report given to RN and Post -op Vital signs reviewed and stable  Post vital signs: Reviewed and stable  Last Vitals:  Vitals Value Taken Time  BP 132/70 10/28/20 1206  Temp    Pulse 72 10/28/20 1208  Resp 17 10/28/20 1208  SpO2 100 % 10/28/20 1208  Vitals shown include unvalidated device data.  Last Pain:  Vitals:   10/28/20 0723  TempSrc:   PainSc: 0-No pain         Complications: No complications documented.

## 2020-10-28 NOTE — H&P (Signed)
CC: Referred for newly diagnosed mass/polypoid lesion in descending colon  HPI: Mr. 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 Paulita Fujita 08/07/20 which showed a large polyp in the descending colon suspicious in appearance but biopsied and returned tubulovillous adenoma. Additionally, ascending colon and transverse colon polyps that were removed and returned as tubular adenomas. The descending colon lesion was tattooed. He was also found to have some mucosal nodule in the rectum and sigmoid as well as in the descending and transverse colon that looked like a venous bleb. He was referred to our office for further evaluation. He is scheduled for a CT abdomen and pelvis 09/04/20. We are adding a CT chest. He denies any complaints today. He denies any abdominal pain, rectal bleeding, nausea or vomiting.  He denies any changes in his health or health history since we met in the office. He tolerated his bowel prep with good result. Has been off his anticoagulation since Sunday; received his prophylactic chemical dvt ppx this morning in preop. Denies any complaints at this time.  PMH: DVT, BPH  PSH: He denies any prior abdominal surgical history  FHx: Maternal Aunt with colon cancer; denies any other FHx of colorectal, breast, endometrial, ovarian or cervical cancer.  Social: Denies use of tobacco (quit 2005)/EtOH/drugs; works in Facilities manager  ROS: A comprehensive 10 system review of systems was completed with the patient and pertinent findings as noted above.  Past Medical History:  Diagnosis Date  . Arthritis   . Coronary artery disease    Seen on CT scan   . Enlarged prostate   . GERD (gastroesophageal reflux disease)     Past Surgical History:  Procedure Laterality Date  . Hemrrhoid      History reviewed. No pertinent family history.  Social:  reports that he has quit  smoking. He quit after 5.00 years of use. He has never used smokeless tobacco. He reports that he does not drink alcohol and does not use drugs.  Allergies: No Known Allergies  Medications: I have reviewed the patient's current medications.  Results for orders placed or performed during the hospital encounter of 10/28/20 (from the past 48 hour(s))  ABO/Rh     Status: None   Collection Time: 10/28/20  7:18 AM  Result Value Ref Range   ABO/RH(D)      B POS Performed at [facility], [place] [address] [place], [place] [address]     No results found.  ROS - all of the below systems have been reviewed with the patient and positives are indicated with bold text General: chills, fever or night sweats Eyes: blurry vision or double vision ENT: epistaxis or sore throat Allergy/Immunology: itchy/watery eyes or nasal congestion Hematologic/Lymphatic: bleeding problems, blood clots or swollen lymph nodes Endocrine: temperature intolerance or unexpected weight changes Breast: new or changing breast lumps or nipple discharge Resp: cough, shortness of breath, or wheezing CV: chest pain or dyspnea on exertion GI: as per HPI GU: dysuria, trouble voiding, or hematuria MSK: joint pain or joint stiffness Neuro: TIA or stroke symptoms Derm: pruritus and skin lesion changes Psych: anxiety and depression  PE Blood pressure 106/65, pulse 67, temperature 97.8 F (36.6 C), temperature source Tympanic, resp. rate 18, weight 73 kg, SpO2 99 %. Constitutional: NAD; conversant; no deformities Eyes: Moist conjunctiva; no lid lag; anicteric; PERRL Neck: Trachea midline; no thyromegaly Lungs: Normal respiratory effort; no tactile  fremitus CV: RRR; no palpable thrills; no pitting edema GI: Abd soft, NT/ND; no palpable hepatosplenomegaly MSK: Normal range of motion of extremities Psychiatric: Appropriate affect; alert and oriented x3  Results for orders placed or performed during the  hospital encounter of 10/28/20 (from the past 48 hour(s))  ABO/Rh     Status: None   Collection Time: 10/28/20  7:18 AM  Result Value Ref Range   ABO/RH(D)      B POS Performed at [facility] [address], [place], [place] [address]     No results found.   A/P: Mr. Darius Andrade is a very pleasant 63yoM with newly diagnosed polypoid lesion in descending colon, not amenable to endoscopic removal; biopsy showed TVA but endoscopic appearance was worrisome for potential malignancy; tattoo'd  No evidence of metastatic disease on his CT CAP  -The anatomy and physiology of the GI tract was discussed with the patient. The pathophysiology of colon polyps and cancer was discussed with associated pictures. -We reviewed robotic assisted left hemicolectomy/segmental colectomy based on location of tattoo; flexible sigmoidoscopy -The planned procedure, material risks (including, but not limited to, pain, bleeding, infection, scarring, need for blood transfusion, damage to surrounding structures- blood vessels/nerves/viscus/organs, damage to ureter, urine leak, leak from anastomosis, need for additional procedures, worsening of pre-existing medical conditions, need for stoma which may be permanent, hernia, recurrence, DVT/PE, pneumonia, heart attack, stroke, death) benefits and alternatives to surgery were discussed at length. The patient's questions were answered to his satisfaction, he voiced understanding and elected to proceed with surgery. Additionally, we discussed typical postoperative expectations and the recovery process.  Nadeen Landau, MD [facility] Use [email or web address] to contact on call provider

## 2020-10-28 NOTE — Anesthesia Preprocedure Evaluation (Addendum)
Anesthesia Evaluation  Patient identified by MRN, date of birth, ID band Patient awake    Reviewed: Allergy & Precautions, NPO status , Patient's Chart, lab work & pertinent test results  Airway Mallampati: II  TM Distance: >3 FB Neck ROM: Full    Dental no notable dental hx. (+) Teeth Intact, Dental Advisory Given   Pulmonary neg pulmonary ROS, former smoker,    Pulmonary exam normal breath sounds clear to auscultation       Cardiovascular + CAD and + DVT  Normal cardiovascular exam Rhythm:Regular Rate:Normal     Neuro/Psych negative neurological ROS  negative psych ROS   GI/Hepatic Neg liver ROS, GERD  ,  Endo/Other  negative endocrine ROS  Renal/GU negative Renal ROS  negative genitourinary   Musculoskeletal  (+) Arthritis ,   Abdominal   Peds  Hematology  (+) Blood dyscrasia (on xarelto), ,   Anesthesia Other Findings Colon mass   Reproductive/Obstetrics                           Anesthesia Physical Anesthesia Plan  ASA: III  Anesthesia Plan: General   Post-op Pain Management:    Induction: Intravenous  PONV Risk Score and Plan: 2 and Midazolam, Dexamethasone and Ondansetron  Airway Management Planned: Oral ETT  Additional Equipment:   Intra-op Plan:   Post-operative Plan: Extubation in OR  Informed Consent: I have reviewed the patients History and Physical, chart, labs and discussed the procedure including the risks, benefits and alternatives for the proposed anesthesia with the patient or authorized representative who has indicated his/her understanding and acceptance.     Dental advisory given  Plan Discussed with: CRNA  Anesthesia Plan Comments:         Anesthesia Quick Evaluation

## 2020-10-28 NOTE — Op Note (Signed)
PATIENT: Darius Andrade  65 y.o. male  Patient Care Team: Marda Stalker, PA-C as PCP - General (Family Medicine)  PREOP DIAGNOSIS: COLON POLYP/MASS  POSTOP DIAGNOSIS: Polypoid mass at the splenic flexure  PROCEDURE: 1. Robotic-assisted partial colectomy 2. Takedown of splenic flexure 3. Intraoperative assessment of perfusion with ICG 4. Bilateral transversus abdominus plane blocks  SURGEON: Sharon Mt. Dema Severin, MD  ASSISTANT: Michael Boston, MD  ANESTHESIA: General endotracheal  EBL: 100 mL Total I/O In: 2100 [I.V.:2000; IV Piggyback:100] Out: 200 [Urine:100; Blood:100]  DRAINS: None  SPECIMEN: Splenic flexure of colon - stitch marks proximal end  COUNTS: Sponge, needle and instrument counts were reported correct x2  FINDINGS: Tattoo apparent at splenic flexure. Mass palpable at this location as well. He does have some mesenteric veins that are enlarged and varicosities in appearance but normal appearing liver and spleen. This would yield concordance to the venous blebs seen on his endoscopy with Dr. Paulita Fujita as well. ICG perfusion test confirmed excellent uptake of the tracer. Segmental colectomy carried out of splenic flexure with side to side colo-colonic anastomosis fashioned.  NARRATIVE: Informed consent was verified. He was taken to the operating room, placed supine on the operating table and SCD's were applied. General endotracheal anesthesia was induced without difficulty. The patient was then positioned in the lithotomy position with Allen stirrups.  A foley catheter was then placed by nursing under sterile conditions. Hair on the abdomen was clipped.  The abdomen was then prepped and draped in the standard sterile fashion. Surgical timeout was called indicating the correct patient, procedure, positioning and need for preoperative antibiotics. We additionally re-confirmed that he had received a prophylactic dose of chemical DVT prophylaxis preoperatively given his  history of DVT.   An OG tube was placed by anesthesia and confirmed to be to suction.  At Palmer's point, a stab incision was created and the Veress needle was introduced into the peritoneal cavity on the first attempt.  Intraperitoneal location was confirmed by the aspiration and saline drop test.  Pneumoperitoneum was established to a maximum pressure of 15 mmHg using CO2.  Following this, the abdomen was marked for planned trocar sites.  Just to the right and cephalad to the umbilicus, an 8 mm incision was created and an 8 mm blunt tipped robotic trocar was cautiously placed into the peritoneal cavity.  The laparoscope was inserted and demonstrated no evidence of trocar site nor Veress needle site complications.  The Veress needle was removed.  Bilateral transversus abdominis plane blocks were then created using a dilute mixture of Exparel with Marcaine.  3 additional 8 mm robotic trochars were placed under direct visualization roughly in a line extending from the right ASIS towards the left upper quadrant.  An additional 5 mm assist port was placed in the right lateral abdomen under direct visualization.  The abdomen was surveyed and there was evident tattoo at the splenic flexure marking the lesion. The rest of the abdomen is normal in appearance. The liver and spleen are grossly normal in appearance. He was positioned in Trendelenburg with some left side up.  Small bowel was carefully retracted out of the pelvis.  The robot was then docked and I went to the console.   The sigmoid colon was readily identified.  Attachments of the sigmoid colon were taken down from the intersigmoid fossa laterally mobilizing the proximal sigmoid and associated mesentery medially, stay in the plane above the left ureter and gonadal vessels.  The descending colon was mobilized up to  and including the distal portion of the splenic flexure from a lateral to medial approach.  He was repositioned in reverse Trendelenburg.  The  omentum was elevated anteriorly and the colon retracted caudad.  The lesser sac was readily gained.  The omentum was mobilized off of the distal half of the transverse colon.  Splenic flexure was then fully mobilized from the medial side.  There are some varicosities within the mesentery that are consistent with what was seen endoscopically within the rectum as well.  The tattoo was seen in the splenic flexure.  There is a polypoid mass at this location.  Attention was then turned to the extracorporeal portion of procedure.  Pneumoperitoneum was released.  A supraumbilical extraction incision was created and dissection carried out through the subcutaneous tissue electrocautery.  Fascia was incised in the midline.  The abdomen was entered.  An [place] wound protector was placed.  Towels were placed on the field.  The splenic flexure was delivered onto the field.  Attention was turned to creating the proximal and distal points of transection.  Windows were created in the mesentery.  The colon was then divided using a 75 mm GIA blue load staplers.  There is a large vascular pedicle going out to the segment of the colon.  There is also a large dominant appearing middle colic pedicle.  The left branch of the middle colic is included with the specimen.  At least 5 cm margins were obtained both proximally and distally.  There is a palpable polypoid mass within this segment of the colon.  The intervening mesentery is then ligated using the LigaSure.  An additional 2-0 silk tie was placed around the vascular pedicle going out to the segment of the colon.  The mesentery was inspected and hemostasis appreciated.  The specimen was passed off after marking the proximal side with a stitch.  A piece of what is likely devascularized omentum was also removed with the LigaSure and passed off.  Attention was then turned to performing the ICG perfusion test.  Under near infrared light, ICG was administered by anesthesia and there  was excellent uptake within the colon on both sides.  This goes out to the staple lines.  The staple lines were hemostatic.   The staple lines were brought together and in a side-to-side manner, there is no tension and they lay nicely together.  2 cm beyond each respective staple line a small colotomy was created.  A colocolonic anastomosis was then created between the mid transverse colon and the mid descending colon.  A 3-0 silk stitch was placed at the apex anastomosis.  The anastomosis was inspected and noted to be hemostatic.  The common colotomy was then closed using a TA 90 blue load stapler.  This includes the prior staple lines.  1 area of oozing at the staple line is controlled with a 3-0 silk U stitch.  This lysed nicely within the abdomen.  Omentum was brought down over this.   Under direct visualization, all trochars are removed and port sites hemostatic.  The [place] wound protector was removed.  Gowns/gloves are changed and a fresh set of clean instruments utilized. Additional sterile drapes were placed around the field.   Sponge, needle, and instrument counts were reported correct. The extraction site fascia was then closed using 2 running #1 PDS sutures.  The fascia was then palpated and noted to be completely closed.  Additional anesthetic was infiltrated at this site.  Sponge, needle, and instrument counts  were then again reported correct. 4-0 Monocryl subcuticular suture was used to close the skin of all incision sites.  Dermabond was placed over all incisions.    He is taken out of lithotomy, awakened from anesthesia, extubated, and transferred to a stretcher for transport to PACU in satisfactory condition having tolerated the procedure well.

## 2020-10-28 NOTE — Anesthesia Procedure Notes (Signed)
Procedure Name: Intubation Performed by: Gean Maidens, CRNA Pre-anesthesia Checklist: Emergency Drugs available, Patient identified, Suction available, Patient being monitored and Timeout performed Patient Re-evaluated:Patient Re-evaluated prior to induction Oxygen Delivery Method: Circle system utilized Preoxygenation: Pre-oxygenation with 100% oxygen Induction Type: IV induction Ventilation: Mask ventilation without difficulty Laryngoscope Size: Mac and 4 Grade View: Grade I Tube type: Oral Tube size: 7.5 mm Number of attempts: 1 Airway Equipment and Method: Stylet Placement Confirmation: ETT inserted through vocal cords under direct vision,  positive ETCO2 and breath sounds checked- equal and bilateral Secured at: 22 cm Tube secured with: Tape Dental Injury: Teeth and Oropharynx as per pre-operative assessment

## 2020-10-29 LAB — BASIC METABOLIC PANEL
Anion gap: 6 (ref 5–15)
BUN: 9 mg/dL (ref 8–23)
CO2: 25 mmol/L (ref 22–32)
Calcium: 9 mg/dL (ref 8.9–10.3)
Chloride: 103 mmol/L (ref 98–111)
Creatinine, Ser: 0.98 mg/dL (ref 0.61–1.24)
GFR, Estimated: >60 mL/min (ref >60)
Glucose, Bld: 109 mg/dL — ABNORMAL HIGH (ref 70–99)
Potassium: 4.3 mmol/L (ref 3.5–5.1)
Sodium: 134 mmol/L — ABNORMAL LOW (ref 135–145)

## 2020-10-29 LAB — SURGICAL PATHOLOGY

## 2020-10-29 LAB — CBC
HCT: 39.5 % (ref 39.0–52.0)
Hemoglobin: 13.8 g/dL (ref 13.0–17.0)
MCH: 32.1 pg (ref 26.0–34.0)
MCHC: 34.9 g/dL (ref 30.0–36.0)
MCV: 91.9 fL (ref 80.0–100.0)
Platelets: 176 10*3/uL (ref 150–400)
RBC: 4.3 MIL/uL (ref 4.22–5.81)
RDW: 12 % (ref 11.5–15.5)
WBC: 17.1 10*3/uL — ABNORMAL HIGH (ref 4.0–10.5)
nRBC: 0 % (ref 0.0–0.2)

## 2020-10-29 MED ORDER — TRAMADOL HCL 50 MG PO TABS: 50.0000 mg | ORAL_TABLET | Freq: Four times a day (QID) | ORAL | 0 refills | Status: AC | PRN

## 2020-10-29 NOTE — Progress Notes (Signed)
Pathology returned -   SURGICAL PATHOLOGY  CASE: [id number]  PATIENT: Darius Andrade  Surgical Pathology Report   Clinical History: Colon polyp mass (crm)   FINAL MICROSCOPIC DIAGNOSIS:   A. COLON, SPLENIC FLEXURE, RESECTION:  - Tubulovillous adenoma, 4.1 cm  - Negative for high-grade dysplasia or carcinoma  - Margins appear viable  - Thirteen benign lymph nodes (0/13)   We will plan to review with him on rounds tomorrow   Nadeen Landau, MD [facility] Use [email or web address] to contact on call provider

## 2020-10-29 NOTE — Progress Notes (Signed)
Subjective No acute events. Feeling reasonably well. Denies nausea or vomiting. Reports some flatus, no bm yet. Pain well controlled  Objective: Vital signs in last 24 hours: Temp:  [97.5 F (36.4 C)-98.7 F (37.1 C)] 98.7 F (37.1 C) (06/02 0447) Pulse Rate:  [57-72] 57 (06/02 0447) Resp:  [12-18] 17 (06/02 0447) BP: (101-133)/(44-93) 110/65 (06/02 0447) SpO2:  [96 %-99 %] 99 % (06/02 0447) Weight:  [74.2 kg] 74.2 kg (06/02 0500) Last BM Date: 10/28/20  Intake/Output from previous day: 06/01 0701 - 06/02 0700 In: 2580 [P.O.:480; I.V.:2000; IV Piggyback:100] Out: 2645 [Urine:2545; Blood:100] Intake/Output this shift: No intake/output data recorded.  Gen: NAD, comfortable CV: RRR Pulm: Normal work of breathing Abd: Soft, NT/ND; incisions c/d/i without erythema or drainage Ext: SCDs in place  Lab Results: CBC  Recent Labs    10/29/20 0415  WBC 17.1*  HGB 13.8  HCT 39.5  PLT 176   BMET Recent Labs    10/29/20 0415  NA 134*  K 4.3  CL 103  CO2 25  GLUCOSE 109*  BUN 9  CREATININE 0.98  CALCIUM 9.0   PT/INR No results for input(s): LABPROT, INR in the last 72 hours. ABG No results for input(s): PHART, HCO3 in the last 72 hours.  Invalid input(s): PCO2, PO2  Studies/Results:  Anti-infectives: Anti-infectives (From admission, onward)   Start     Dose/Rate Route Frequency Ordered Stop   10/28/20 0700  cefoTEtan (CEFOTAN) 2 g in sodium chloride 0.9 % 100 mL IVPB        2 g 200 mL/hr over 30 Minutes Intravenous On call to O.R. 10/28/20 0647 10/28/20 0903       Assessment/Plan: Patient Active Problem List   Diagnosis Date Noted  . S/P laparoscopic-assisted sigmoidectomy 10/28/2020   s/p Procedure(s): ROBOTIC ASSISTED SEGMENTAL COLECTOMY, INTRAOP ASSESSMENT OF TISSUE  PERFUSION, TAP BLOCK 10/28/2020  -Recovering well; asking to go home; spent time reviewing rational/safety for inpatient management -Advance diet as tolerated -D/C IVF; foley  removed -Repeat labs tomorrow -PPx: SQH, SCDs -We spent time reviewing his procedure, findings and plans moving forward -Xarelto to be restarted in coming days   LOS: 1 day   Nadeen Landau, MD [facility] Use [email or web address] to contact on call provider

## 2020-10-29 NOTE — Anesthesia Postprocedure Evaluation (Signed)
Anesthesia Post Note  Patient: Darius Andrade  Procedure(s) Performed: ROBOTIC ASSISTED SEGMENTAL COLECTOMY, INTRAOP ASSESSMENT OF TISSUE  PERFUSION, TAP BLOCK (N/A Abdomen)     Patient location during evaluation: PACU Anesthesia Type: General Level of consciousness: awake and alert Pain management: pain level controlled Vital Signs Assessment: post-procedure vital signs reviewed and stable Respiratory status: spontaneous breathing, nonlabored ventilation, respiratory function stable and patient connected to nasal cannula oxygen Cardiovascular status: blood pressure returned to baseline and stable Postop Assessment: no apparent nausea or vomiting Anesthetic complications: no   No complications documented.  Last Vitals:  Vitals:   10/29/20 0129 10/29/20 0447  BP: 123/65 110/65  Pulse: 64 (!) 57  Resp: 18 17  Temp: 36.6 C 37.1 C  SpO2: 98% 99%    Last Pain:  Vitals:   10/29/20 0447  TempSrc: Oral  PainSc:                  Lucina Betty L Ignacio Lowder

## 2020-10-29 NOTE — Discharge Instructions (Signed)
POST OP INSTRUCTIONS AFTER COLON SURGERY  1. DIET: Be sure to include lots of fluids daily to stay hydrated - 64oz of water per day (8, 8 oz glasses).  Avoid fast food or heavy meals for the first couple of weeks as your are more likely to get nauseated. Avoid raw/uncooked fruits or vegetables for the first 4 weeks (its ok to have these if they are blended into smoothie form). If you have fruits/vegetables, make sure they are cooked until soft enough to mash on the roof of your mouth and chew your food well. Otherwise, diet as tolerated.  2. Take your usually prescribed home medications unless otherwise directed.  3. PAIN CONTROL: a. Pain is best controlled by a usual combination of three different methods TOGETHER: i. Ice/Heat ii. Over the counter pain medication iii. Prescription pain medication b. Most patients will experience some swelling and bruising around the surgical site.  Ice packs or heating pads (30-60 minutes up to 6 times a day) will help. Some people prefer to use ice alone, heat alone, alternating between ice & heat.  Experiment to what works for you.  Swelling and bruising can take several weeks to resolve.   c. It is helpful to take an over-the-counter pain medication regularly for the first few weeks: i. Ibuprofen (Motrin/Advil) - 200mg tabs - take 3 tabs (600mg) every 6 hours as needed for pain (unless you have been directed previously to avoid NSAIDs/ibuprofen) ii. Acetaminophen (Tylenol) - you may take 650mg every 6 hours as needed. You can take this with motrin as they act differently on the body. If you are taking a narcotic pain medication that has acetaminophen in it, do not take over the counter tylenol at the same time. iii. NOTE: You may take both of these medications together - most patients  find it most helpful when alternating between the two (i.e. Ibuprofen at 6am, tylenol at 9am, ibuprofen at 12pm ...) d. A  prescription for pain medication should be given to you  upon discharge.  Take your pain medication as prescribed if your pain is not adequatly controlled with the over-the-counter pain reliefs mentioned above.  4. Avoid getting constipated.  Between the surgery and the pain medications, it is common to experience some constipation.  Increasing fluid intake and taking a fiber supplement (such as Metamucil, Citrucel, FiberCon, MiraLax, etc) 1-2 times a day regularly will usually help prevent this problem from occurring.  A mild laxative (prune juice, Milk of Magnesia, MiraLax, etc) should be taken according to package directions if there are no bowel movements after 48 hours.    5. Dressing: Your incisions are covered in Dermabond which is like sterile superglue for the skin. This will come off on it's own in a couple weeks. It is waterproof and you may bathe normally starting the day after your surgery in a shower. Avoid baths/pools/lakes/oceans until your wounds have fully healed.  6. ACTIVITIES as tolerated:   a. Avoid heavy lifting (>10lbs or 1 gallon of milk) for the next 6 weeks. b. You may resume regular daily activities as tolerated--such as daily self-care, walking, climbing stairs--gradually increasing activities as tolerated.  If you can walk 30 minutes without difficulty, it is safe to try more intense activity such as jogging, treadmill, bicycling, low-impact aerobics.  c. DO NOT PUSH THROUGH PAIN.  Let pain be your guide: If it hurts to do something, don't do it. d. You may drive when you are no longer taking prescription pain medication, you   can comfortably wear a seatbelt, and you can safely maneuver your car and apply brakes.  7. FOLLOW UP in our office a. Please call [facility] at [phone] to set up an appointment to see your surgeon in the office for a follow-up appointment approximately 2 weeks after your surgery. b. Make sure that you call for this appointment the day you arrive home to insure a convenient appointment time.  9. If you  have disability or family leave forms that need to be completed, you may have them completed by your primary care physician's office; for return to work instructions, please ask our office staff and they will be happy to assist you in obtaining this documentation   When to call us [phone]: 1. Poor pain control 2. Reactions / problems with new medications (rash/itching, etc)  3. Fever over 101.5 F (38.5 C) 4. Inability to urinate 5. Nausea/vomiting 6. Worsening swelling or bruising 7. Continued bleeding from incision. 8. Increased pain, redness, or drainage from the incision  The clinic staff is available to answer your questions during regular business hours (8:30am-5pm).  Please don't hesitate to call and ask to speak to one of our nurses for clinical concerns.   A surgeon from [facility] is always on call at the hospitals   If you have a medical emergency, go to the nearest emergency room or call 911.  [facility] [address], [address], [place], [place]  [address] MAIN: [phone] FAX: [phone] [email or web address] 

## 2020-10-30 LAB — CBC
HCT: 40.9 % (ref 39.0–52.0)
Hemoglobin: 14.3 g/dL (ref 13.0–17.0)
MCH: 32.1 pg (ref 26.0–34.0)
MCHC: 35 g/dL (ref 30.0–36.0)
MCV: 91.9 fL (ref 80.0–100.0)
Platelets: 172 10*3/uL (ref 150–400)
RBC: 4.45 MIL/uL (ref 4.22–5.81)
RDW: 12.1 % (ref 11.5–15.5)
WBC: 13 10*3/uL — ABNORMAL HIGH (ref 4.0–10.5)
nRBC: 0 % (ref 0.0–0.2)

## 2020-10-30 LAB — BASIC METABOLIC PANEL
Anion gap: 6 (ref 5–15)
BUN: 10 mg/dL (ref 8–23)
CO2: 27 mmol/L (ref 22–32)
Calcium: 8.8 mg/dL — ABNORMAL LOW (ref 8.9–10.3)
Chloride: 102 mmol/L (ref 98–111)
Creatinine, Ser: 1.01 mg/dL (ref 0.61–1.24)
GFR, Estimated: >60 mL/min (ref >60)
Glucose, Bld: 125 mg/dL — ABNORMAL HIGH (ref 70–99)
Potassium: 4 mmol/L (ref 3.5–5.1)
Sodium: 135 mmol/L (ref 135–145)

## 2020-10-30 MED ORDER — RIVAROXABAN 10 MG PO TABS
20.0000 mg | ORAL_TABLET | Freq: Every day | ORAL | Status: DC
  Administered 2020-10-30 – 2020-10-31 (×2): 20 mg via ORAL
  Filled 2020-10-30 (×2): qty 2

## 2020-10-30 MED ORDER — LACTATED RINGERS IV SOLN: INTRAVENOUS | Status: DC

## 2020-10-30 NOTE — Progress Notes (Signed)
Subjective No acute events. Tolerating soft diet but with some early satiety. No n/v. Small amount of flatus, no significant bowel movement yet  Objective: Vital signs in last 24 hours: Temp:  [98 F (36.7 C)-98.8 F (37.1 C)] 98.6 F (37 C) (06/03 0532) Pulse Rate:  [55-60] 55 (06/03 0532) Resp:  [15-18] 16 (06/03 0532) BP: (112-133)/(75-86) 112/75 (06/03 0532) SpO2:  [96 %-98 %] 97 % (06/03 0532) Weight:  [72.6 kg] 72.6 kg (06/03 0500) Last BM Date: 10/28/20  Intake/Output from previous day: 06/02 0701 - 06/03 0700 In: 600 [P.O.:600] Out: 1925 [Urine:1925] Intake/Output this shift: Total I/O In: -  Out: 1225 [Urine:1225]  Gen: NAD, comfortable CV: RRR Pulm: Normal work of breathing Abd: Soft, NT/ND Ext: SCDs in place  Lab Results: CBC  Recent Labs    10/29/20 0415 10/30/20 0418  WBC 17.1* 13.0*  HGB 13.8 14.3  HCT 39.5 40.9  PLT 176 172   BMET Recent Labs    10/29/20 0415 10/30/20 0418  NA 134* 135  K 4.3 4.0  CL 103 102  CO2 25 27  GLUCOSE 109* 125*  BUN 9 10  CREATININE 0.98 1.01  CALCIUM 9.0 8.8*   PT/INR No results for input(s): LABPROT, INR in the last 72 hours. ABG No results for input(s): PHART, HCO3 in the last 72 hours.  Invalid input(s): PCO2, PO2  Studies/Results:  Anti-infectives: Anti-infectives (From admission, onward)   Start     Dose/Rate Route Frequency Ordered Stop   10/28/20 0700  cefoTEtan (CEFOTAN) 2 g in sodium chloride 0.9 % 100 mL IVPB        2 g 200 mL/hr over 30 Minutes Intravenous On call to O.R. 10/28/20 0647 10/28/20 0903       Assessment/Plan: Patient Active Problem List   Diagnosis Date Noted  . S/P laparoscopic-assisted sigmoidectomy 10/28/2020   s/p Procedure(s): ROBOTIC ASSISTED SEGMENTAL COLECTOMY, INTRAOP ASSESSMENT OF TISSUE  PERFUSION, TAP BLOCK 10/28/2020   -Recovering well -Continue soft diet -Hgb stable; restart Xarelto -PPx: SQH, SCDs -Dispo: Pending bowel movement, reliable toleration  of diet   LOS: 2 days   Nadeen Landau, MD [facility] Use [email or web address] to contact on call provider

## 2020-10-31 LAB — CBC
HCT: 43.2 % (ref 39.0–52.0)
Hemoglobin: 14.9 g/dL (ref 13.0–17.0)
MCH: 31.2 pg (ref 26.0–34.0)
MCHC: 34.5 g/dL (ref 30.0–36.0)
MCV: 90.4 fL (ref 80.0–100.0)
Platelets: 220 10*3/uL (ref 150–400)
RBC: 4.78 MIL/uL (ref 4.22–5.81)
RDW: 11.9 % (ref 11.5–15.5)
WBC: 14.5 10*3/uL — ABNORMAL HIGH (ref 4.0–10.5)
nRBC: 0 % (ref 0.0–0.2)

## 2020-10-31 LAB — BASIC METABOLIC PANEL
Anion gap: 10 (ref 5–15)
BUN: 11 mg/dL (ref 8–23)
CO2: 26 mmol/L (ref 22–32)
Calcium: 9.1 mg/dL (ref 8.9–10.3)
Chloride: 98 mmol/L (ref 98–111)
Creatinine, Ser: 0.93 mg/dL (ref 0.61–1.24)
GFR, Estimated: >60 mL/min (ref >60)
Glucose, Bld: 123 mg/dL — ABNORMAL HIGH (ref 70–99)
Potassium: 4 mmol/L (ref 3.5–5.1)
Sodium: 134 mmol/L — ABNORMAL LOW (ref 135–145)

## 2020-10-31 MED ORDER — LACTATED RINGERS IV SOLN: INTRAVENOUS | Status: DC

## 2020-10-31 NOTE — Progress Notes (Signed)
Subjective Nausea and some vomiting with soft food yesterday. Passing flatus, but no bowel movements.    Objective: Vital signs in last 24 hours: Temp:  [97.9 F (36.6 C)-98.7 F (37.1 C)] 98.7 F (37.1 C) (06/04 0616) Pulse Rate:  [50-60] 60 (06/04 0616) Resp:  [16-18] 16 (06/04 0616) BP: (130-153)/(81-91) 130/89 (06/04 0616) SpO2:  [96 %-99 %] 96 % (06/04 0616) Last BM Date: 10/30/20  Intake/Output from previous day: 06/03 0701 - 06/04 0700 In: 1471.3 [P.O.:540; I.V.:931.3] Out: 475 [Urine:475] Intake/Output this shift: No intake/output data recorded.  Gen: NAD, comfortable CV: RRR Pulm: Normal work of breathing Abd: Soft, tender around incisions, maybe some mild distention. Ext: SCDs in place  Lab Results: CBC  Recent Labs    10/30/20 0418 10/31/20 0355  WBC 13.0* 14.5*  HGB 14.3 14.9  HCT 40.9 43.2  PLT 172 220   BMET Recent Labs    10/30/20 0418 10/31/20 0355  NA 135 134*  K 4.0 4.0  CL 102 98  CO2 27 26  GLUCOSE 125* 123*  BUN 10 11  CREATININE 1.01 0.93  CALCIUM 8.8* 9.1   PT/INR No results for input(s): LABPROT, INR in the last 72 hours. ABG No results for input(s): PHART, HCO3 in the last 72 hours.  Invalid input(s): PCO2, PO2  Studies/Results:  Anti-infectives: Anti-infectives (From admission, onward)   Start     Dose/Rate Route Frequency Ordered Stop   10/28/20 0700  cefoTEtan (CEFOTAN) 2 g in sodium chloride 0.9 % 100 mL IVPB        2 g 200 mL/hr over 30 Minutes Intravenous On call to O.R. 10/28/20 0647 10/28/20 0903       Assessment/Plan: Patient Active Problem List   Diagnosis Date Noted  . S/P laparoscopic-assisted sigmoidectomy 10/28/2020   s/p Procedure(s): ROBOTIC ASSISTED SEGMENTAL COLECTOMY, INTRAOP ASSESSMENT OF TISSUE  PERFUSION, TAP BLOCK 10/28/2020   -May have eaten to much yesterday -Step back to full liquid diet -Awaiting bowel movement -Hgb stable; restart Xarelto -PPx: SQH, SCDs -Dispo: Pending bowel  movement, reliable toleration of diet   LOS: 3 days   Felicie Morn, MD General, Bariatric and Minimally Invasive Surgery [facility], [place]

## 2020-11-01 NOTE — Progress Notes (Signed)
Nurse reviewed discharge instructions with pt.  Pt verbalized understanding of discharge instructions, follow up appointments and new medications.  No concerns at time of discharge. 

## 2020-11-01 NOTE — Discharge Summary (Signed)
  Patient ID: Darius Andrade [id number] 65 y.o. 1955/06/26  10/28/2020  Discharge date and time: 11/01/2020  Admitting Physician: [place]  Discharge Physician: [place]  Admission Diagnoses: S/P laparoscopic-assisted sigmoidectomy [Z90.49] Patient Active Problem List   Diagnosis Date Noted  . 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 S/P laparoscopic-assisted sigmoidectomy 10/28/2020    Operations: Procedure(s): ROBOTIC ASSISTED SEGMENTAL COLECTOMY, INTRAOP ASSESSMENT OF TISSUE  PERFUSION, TAP BLOCK  Admission Condition: good  Discharged Condition: good  Indication for Admission:  Darius Andrade is a very pleasant 37yoM with newly diagnosed polypoid lesion in descending colon, not amenable to endoscopic removal; biopsy showed TVA but endoscopic appearance was worrisome for potential malignancy; tattoo'd  Hospital Course: Darius Andrade underwent elective robotic assisted partial colectomy with takedown of splenic flexure and intraoperative asssessment of perfusion with ICG and bilateral TAP block on 10/28/20.  He recovered well in the hospital and was discharged on 11/01/20, tolerating a diet, ambulating, pain controlled on oral pain medication.  Consults: None  Significant Diagnostic Studies: none  Treatments: surgery: as above  Disposition: Home  Patient Instructions:  Allergies as of 11/01/2020   No Known Allergies     Medication List    STOP taking these medications   metroNIDAZOLE 500 MG tablet Commonly known as: FLAGYL   neomycin 500 MG tablet Commonly known as: MYCIFRADIN     TAKE these medications   polyethylene glycol-electrolytes 420 g solution Commonly known as: NuLYTELY Take 4,000 mLs by mouth as directed.   tamsulosin 0.4 MG Caps capsule Commonly known as: FLOMAX Take 0.4 mg by mouth daily.   traMADol 50 MG  tablet Commonly known as: Ultram Take 1 tablet (50 mg total) by mouth every 6 (six) hours as needed for up to 5 days (postop pain not controlled with tylenol and ibuprofen first).   Xarelto 20 MG Tabs tablet Generic drug: rivaroxaban Take 20 mg by mouth daily.       Activity: no heavy lifting for 4 weeks Diet: regular diet Wound Care: keep wound clean and dry  Follow-up:  With Dr. Dema Severin as previously scheduled  Signed: [facility], Bariatric, & Minimally Invasive Surgery [facility], [place]   11/01/2020, 8:32 AM

## 2020-11-20 ENCOUNTER — Telehealth: Payer: Self-pay | Admitting: Hematology and Oncology

## 2020-11-20 NOTE — Telephone Encounter (Signed)
Darius Andrade was referred back in May by Darius Stalker, PA to see a hematologist for acute dvt. At the time the pt was unable to make an appt. Darius Andrade returned my call and has now been scheduled to see Dr. Lorenso Courier on 7/1 at 11am. Pt aware to arrive 15 minutes early.

## 2020-11-27 ENCOUNTER — Inpatient Hospital Stay: Payer: 59 | Attending: Hematology and Oncology | Admitting: Hematology and Oncology

## 2020-11-27 ENCOUNTER — Inpatient Hospital Stay: Payer: 59

## 2020-11-27 ENCOUNTER — Other Ambulatory Visit: Payer: Self-pay

## 2020-11-27 VITALS — BP 115/75 | HR 75 | Temp 97.1°F | Resp 18 | Ht 69.0 in | Wt 156.7 lb

## 2020-11-27 DIAGNOSIS — I82511 Chronic embolism and thrombosis of right femoral vein: Secondary | ICD-10-CM

## 2020-11-27 DIAGNOSIS — Z7901 Long term (current) use of anticoagulants: Secondary | ICD-10-CM | POA: Insufficient documentation

## 2020-11-27 DIAGNOSIS — K635 Polyp of colon: Secondary | ICD-10-CM | POA: Diagnosis not present

## 2020-11-27 DIAGNOSIS — Z86718 Personal history of other venous thrombosis and embolism: Secondary | ICD-10-CM | POA: Insufficient documentation

## 2020-11-27 LAB — CBC WITH DIFFERENTIAL (CANCER CENTER ONLY)
Abs Immature Granulocytes: 0.02 10*3/uL (ref 0.00–0.07)
Basophils Absolute: 0.1 10*3/uL (ref 0.0–0.1)
Basophils Relative: 1 %
Eosinophils Absolute: 0.1 10*3/uL (ref 0.0–0.5)
Eosinophils Relative: 1 %
HCT: 42 % (ref 39.0–52.0)
Hemoglobin: 14.7 g/dL (ref 13.0–17.0)
Immature Granulocytes: 0 %
Lymphocytes Relative: 15 %
Lymphs Abs: 1.1 10*3/uL (ref 0.7–4.0)
MCH: 31.5 pg (ref 26.0–34.0)
MCHC: 35 g/dL (ref 30.0–36.0)
MCV: 89.9 fL (ref 80.0–100.0)
Monocytes Absolute: 0.8 10*3/uL (ref 0.1–1.0)
Monocytes Relative: 11 %
Neutro Abs: 5.3 10*3/uL (ref 1.7–7.7)
Neutrophils Relative %: 72 %
Platelet Count: 270 10*3/uL (ref 150–400)
RBC: 4.67 MIL/uL (ref 4.22–5.81)
RDW: 12.1 % (ref 11.5–15.5)
WBC Count: 7.4 10*3/uL (ref 4.0–10.5)
nRBC: 0 % (ref 0.0–0.2)

## 2020-11-27 LAB — CMP (CANCER CENTER ONLY)
ALT: 19 U/L (ref 0–44)
AST: 14 U/L — ABNORMAL LOW (ref 15–41)
Albumin: 3.3 g/dL — ABNORMAL LOW (ref 3.5–5.0)
Alkaline Phosphatase: 62 U/L (ref 38–126)
Anion gap: 8 (ref 5–15)
BUN: 16 mg/dL (ref 8–23)
CO2: 24 mmol/L (ref 22–32)
Calcium: 8.9 mg/dL (ref 8.9–10.3)
Chloride: 107 mmol/L (ref 98–111)
Creatinine: 1 mg/dL (ref 0.61–1.24)
GFR, Estimated: >60 mL/min (ref >60)
Glucose, Bld: 89 mg/dL (ref 70–99)
Potassium: 4.4 mmol/L (ref 3.5–5.1)
Sodium: 139 mmol/L (ref 135–145)
Total Bilirubin: 0.6 mg/dL (ref 0.3–1.2)
Total Protein: 5.9 g/dL — ABNORMAL LOW (ref 6.5–8.1)

## 2020-11-28 LAB — CARDIOLIPIN ANTIBODIES, IGG, IGM, IGA
Anticardiolipin IgA: <9 APL U/mL (ref 0–11)
Anticardiolipin IgG: <9 GPL U/mL (ref 0–14)
Anticardiolipin IgM: <9 MPL U/mL (ref 0–12)

## 2020-11-29 LAB — LUPUS ANTICOAGULANT PANEL
DRVVT: 172.4 s — ABNORMAL HIGH (ref 0.0–47.0)
PTT Lupus Anticoagulant: 37.8 s (ref 0.0–51.9)

## 2020-11-29 LAB — BETA-2-GLYCOPROTEIN I ABS, IGG/M/A
Beta-2 Glyco I IgG: <9 GPI IgG units (ref 0–20)
Beta-2-Glycoprotein I IgA: <9 GPI IgA units (ref 0–25)
Beta-2-Glycoprotein I IgM: <9 GPI IgM units (ref 0–32)

## 2020-11-29 LAB — DRVVT MIX: dRVVT Mix: 102.6 s — ABNORMAL HIGH (ref 0.0–40.4)

## 2020-11-29 LAB — DRVVT CONFIRM: dRVVT Confirm: 2.5 ratio — ABNORMAL HIGH (ref 0.8–1.2)

## 2020-12-08 ENCOUNTER — Telehealth: Payer: Self-pay | Admitting: Hematology and Oncology

## 2020-12-08 NOTE — Progress Notes (Signed)
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 Started on Xarelto therapy.  11/27/2020: establish care with Dr. Lorenso Courier  # Colon Polyp Mass 11/01/2020: robotic assisted segmental colectomy. Pathology revealed a tublovillous adenoma, 4.1 cm negative for high grade dysplasia or carcinoma. Thirteen lymph nodes were benign.   CHIEF COMPLAINTS/PURPOSE OF CONSULTATION:  "DVT "  HISTORY OF PRESENTING ILLNESS:  Darius Andrade 65 y.o. male with medical history significant for GERD, CAD, and enlarged prostate who presents to discuss anticoagulation duration for a right lower extremity DVT.  On review of the previous records Darius Andrade presented to the emergency department on 02/18/2020 at which time an ultrasound of his lower extremity revealed an occlusive DVT involving the right profundal femoral vein extending into the popliteal and calf veins below the knee.  He was started on Xarelto therapy at that time.  Was recently on 11/01/2020 the patient underwent a robotic assisted segmental colonoscopy for a massive polyp.  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  13 lymph nodes were benign.  Due to concern for this patient's VTE he was referred to hematology for further evaluation and management.  On exam today Darius Andrade reports that they originally found his massive polyp during a colonoscopy in March 2021.  He noted it was "huge" and that is why they needs to perform the surgery.  He reports that he had his COVID-vaccine in May 2021.  He reports that he has been tolerating Xarelto therapy well since  September.  He denies having any issues with bleeding, bruising, or dark stools.  He reports he is on the patient assistance program and is not having difficulty affording the medication.  On further discussion the patient notes that his family history is remarkable for a "rare blood disorder that his mother had.  As there is no family history markable for cancer.  He was previously a smoker but quit in 2005.  He does not drink any alcohol.  He notes that his job he does stand in place for long periods of time and stretch.  He otherwise denies any fevers, chills, sweats, nausea, vomiting or diarrhea.  A full 10 point ROS is listed below.  MEDICAL HISTORY:  Past Medical History:  Diagnosis Date   Arthritis    Coronary artery disease    Seen on CT scan    Enlarged prostate    GERD (gastroesophageal reflux disease)     SURGICAL HISTORY: Past Surgical History:  Procedure Laterality Date   Hemrrhoid      SOCIAL HISTORY: Social History   Socioeconomic History   Marital status: Legally Separated    Spouse name: Not on file   Number of children: Not on file   Years of education: Not on file   Highest education level: Not on file  Occupational History   Not on file  Tobacco Use   Smoking status: Former    Years: 5.00    Pack years: 0.00    Types: Cigarettes   Smokeless tobacco: Never   Tobacco comments:    quit 2005  Vaping Use   Vaping Use: Never used  Substance and Sexual Activity  Alcohol use: Never   Drug use: Never   Sexual activity: Not Currently  Other Topics Concern   Not on file  Social History Narrative   Not on file   Social Determinants of Health   Financial Resource Strain: Not on file  Food Insecurity: Not on file  Transportation Needs: Not on file  Physical Activity: Not on file  Stress: Not on file  Social Connections: Not on file  Intimate Partner Violence: Not on file    FAMILY HISTORY: No family history on file.  ALLERGIES:  has No Known  Allergies.  MEDICATIONS:  Current Outpatient Medications  Medication Sig Dispense Refill   tamsulosin (FLOMAX) 0.4 MG CAPS capsule Take 0.4 mg by mouth daily.     XARELTO 20 MG TABS tablet Take 20 mg by mouth daily.     No current facility-administered medications for this visit.    REVIEW OF SYSTEMS:   Constitutional: ( - ) fevers, ( - )  chills , ( - ) night sweats Eyes: ( - ) blurriness of vision, ( - ) double vision, ( - ) watery eyes Ears, nose, mouth, throat, and face: ( - ) mucositis, ( - ) sore throat Respiratory: ( - ) cough, ( - ) dyspnea, ( - ) wheezes Cardiovascular: ( - ) palpitation, ( - ) chest discomfort, ( - ) lower extremity swelling Gastrointestinal:  ( - ) nausea, ( - ) heartburn, ( - ) change in bowel habits Skin: ( - ) abnormal skin rashes Lymphatics: ( - ) new lymphadenopathy, ( - ) easy bruising Neurological: ( - ) numbness, ( - ) tingling, ( - ) new weaknesses Behavioral/Psych: ( - ) mood change, ( - ) new changes  All other systems were reviewed with the patient and are negative.  PHYSICAL EXAMINATION:  Vitals:   11/27/20 1109  BP: 115/75  Pulse: 75  Resp: 18  Temp: (!) 97.1 F (36.2 C)  SpO2: 98%   Filed Weights   11/27/20 1109  Weight: 156 lb 11.2 oz (71.1 kg)    GENERAL: well appearing middle aged Caucasian male in NAD  SKIN: skin color, texture, turgor are normal, no rashes or significant lesions EYES: conjunctiva are pink and non-injected, sclera clear LUNGS: clear to auscultation and percussion with normal breathing effort HEART: regular rate & rhythm and no murmurs and no lower extremity edema PSYCH: alert & oriented x 3, fluent speech NEURO: no focal motor/sensory deficits  LABORATORY DATA:  I have reviewed the data as listed CBC Latest Ref Rng & Units 11/27/2020 10/31/2020 10/30/2020  WBC 4.0 - 10.5 K/uL 7.4 14.5(H) 13.0(H)  Hemoglobin 13.0 - 17.0 g/dL 14.7 14.9 14.3  Hematocrit 39.0 - 52.0 % 42.0 43.2 40.9  Platelets 150 - 400 K/uL  270 220 172    CMP Latest Ref Rng & Units 11/27/2020 10/31/2020 10/30/2020  Glucose 70 - 99 mg/dL 89 123(H) 125(H)  BUN 8 - 23 mg/dL 16 11 10   Creatinine 0.61 - 1.24 mg/dL 1.00 0.93 1.01  Sodium 135 - 145 mmol/L 139 134(L) 135  Potassium 3.5 - 5.1 mmol/L 4.4 4.0 4.0  Chloride 98 - 111 mmol/L 107 98 102  CO2 22 - 32 mmol/L 24 26 27   Calcium 8.9 - 10.3 mg/dL 8.9 9.1 8.8(L)  Total Protein 6.5 - 8.1 g/dL 5.9(L) - -  Total Bilirubin 0.3 - 1.2 mg/dL 0.6 - -  Alkaline Phos 38 - 126 U/L 62 - -  AST 15 - 41 U/L 14(L) - -  ALT 0 - 44 U/L 19 - -    RADIOGRAPHIC STUDIES: No results found.  ASSESSMENT & PLAN Darius Andrade 65 y.o. male with medical history significant for GERD, CAD, and enlarged prostate who presents to discuss anticoagulation duration for a right lower extremity DVT.  After review of the labs, review of the records, and discussion with the patient the patients findings are most consistent with an unprovoked right lower extremity DVT.  Given these findings recommendation will be for lifelong anticoagulation given the fact that this was above the knee and has no clear explanation.  The patient is currently on Xarelto 20 mg p.o. daily and would have the option to drop down to Xarelto 10 mg as maintenance dosing.  The patient voices understanding of this plan moving forward.  # Unprovoked RLE DVT -- Findings are most consistent with an unprovoked right lower extremity DVT.  Therefore the recommendation would be for indefinite anticoagulation --The patient has been on Xarelto therapy for greater than 6 months.  He would have the option of dropped down to maintenance dose 10 mg p.o. daily. Once he has run out of his current supply we can refill at 10 mg if requested.  --If the medication were found to be cost inhibitive or if you want to discontinue altogether we could consider transitioning to 81 mg p.o. daily of aspirin, though data is clear this is inferior to maintenance dose  Xarelto --RTC in 6 months time to assess   # Colon Polyp Mass --continue f/u per GI recommendations.  Orders Placed This Encounter  Procedures   CBC with Differential ([place] Only)    Standing Status:   Future    Number of Occurrences:   1    Standing Expiration Date:   11/27/2021   CMP ([place] only)    Standing Status:   Future    Number of Occurrences:   1    Standing Expiration Date:   11/27/2021   Beta-2-glycoprotein i abs, IgG/M/A    Standing Status:   Future    Number of Occurrences:   1    Standing Expiration Date:   11/27/2021   Cardiolipin antibodies, IgG, IgM, IgA*    Standing Status:   Future    Number of Occurrences:   1    Standing Expiration Date:   11/27/2021   Lupus anticoagulant panel*    Standing Status:   Future    Number of Occurrences:   1    Standing Expiration Date:   11/27/2021    All questions were answered. The patient knows to call the clinic with any problems, questions or concerns.  A total of more than 60 minutes were spent on this encounter with face-to-face time and non-face-to-face time, including preparing to see the patient, ordering tests and/or medications, counseling the patient and coordination of care as outlined above.   Ledell Peoples, MD Department of Hematology/Oncology [place] [facility] Phone: [phone] Pager: [phone] Email: [email or web address]  12/08/2020 12:42 PM

## 2020-12-08 NOTE — Telephone Encounter (Signed)
Scheduled appointment per 07/12 sch msg. Left message 

## 2020-12-11 ENCOUNTER — Telehealth: Payer: Self-pay | Admitting: *Deleted

## 2020-12-11 NOTE — Telephone Encounter (Signed)
Received vm message from patient requesting lab results and interpretation of results from his visit on 11/27/20  Please advise or call patient

## 2020-12-14 ENCOUNTER — Telehealth: Payer: Self-pay | Admitting: *Deleted

## 2020-12-14 NOTE — Telephone Encounter (Signed)
-----   Message from Orson Slick, MD sent at 12/14/2020 10:26 AM EDT ----- Please let Darius Andrade know that his blood work looks stable. We did not find any evidence of a clotting disorder that could explain his DVT. Recommend continued Xarelto. When it is time for a refill we will call in Xarelto 10mg  (maintenance dose). We plan to see him back in Jan 2023.  ----- Message ----- From: [place]: 11/27/2020  12:44 PM EDT To: Orson Slick, MD

## 2020-12-14 NOTE — Telephone Encounter (Signed)
TCT patient regarding recent lab results. No answer but was able to give his mother lab results-stable results, no evidence of a clotting disorder. He is to remain on his Xarelto 20 mg until his next refill. At that point Dr. Lorenso Courier can reduce his dosage to 10 mg daily for maintenance. Advised that pt call when he needs his next refill.

## 2021-06-10 ENCOUNTER — Ambulatory Visit: Payer: 59 | Admitting: Hematology and Oncology

## 2021-06-10 ENCOUNTER — Other Ambulatory Visit: Payer: 59

## 2022-08-24 DIAGNOSIS — L821 Other seborrheic keratosis: Secondary | ICD-10-CM | POA: Diagnosis not present

## 2022-08-24 DIAGNOSIS — L570 Actinic keratosis: Secondary | ICD-10-CM | POA: Diagnosis not present

## 2022-08-24 DIAGNOSIS — C4441 Basal cell carcinoma of skin of scalp and neck: Secondary | ICD-10-CM | POA: Diagnosis not present

## 2022-08-24 DIAGNOSIS — D225 Melanocytic nevi of trunk: Secondary | ICD-10-CM | POA: Diagnosis not present

## 2022-08-24 DIAGNOSIS — Z85828 Personal history of other malignant neoplasm of skin: Secondary | ICD-10-CM | POA: Diagnosis not present

## 2022-08-24 DIAGNOSIS — D485 Neoplasm of uncertain behavior of skin: Secondary | ICD-10-CM | POA: Diagnosis not present

## 2022-08-24 DIAGNOSIS — L905 Scar conditions and fibrosis of skin: Secondary | ICD-10-CM | POA: Diagnosis not present

## 2023-03-05 IMAGING — CT CT CHEST W/ CM
2 of 5 series · 14 of 46 positions shown, 16 images · IV contrast (iopamidol)
Comparison: None.

CLINICAL DATA: Malignant neoplasm of colon polyp. Vascular
malformation. Right lower extremity DVT on 02/18/2020

EXAM:
CT CHEST, ABDOMEN, AND PELVIS WITH CONTRAST
TECHNIQUE: Multidetector CT imaging of the chest, abdomen and pelvis was
performed following the standard protocol during bolus
administration of intravenous contrast.
CONTRAST:  100mL [id number] IOPAMIDOL ([id number]) INJECTION 61%

[Series 2: cap with 5.00 br40 s3 axial · axial · 0.75mm/px · z∈[+1152,+1692]mm · 11 of 130 slices shown, 13 images]
[im 11/130  soft-tissue]
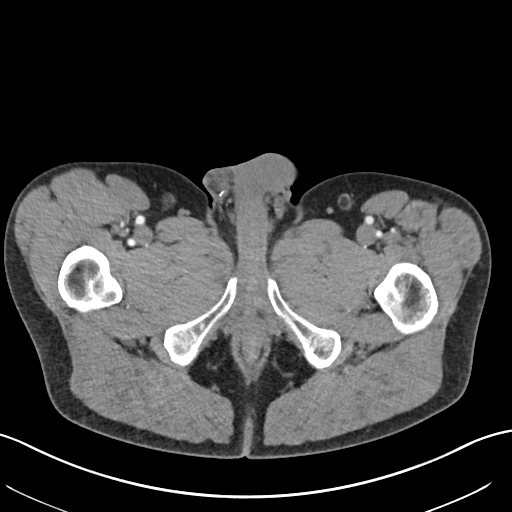
[im 11/130  bone]
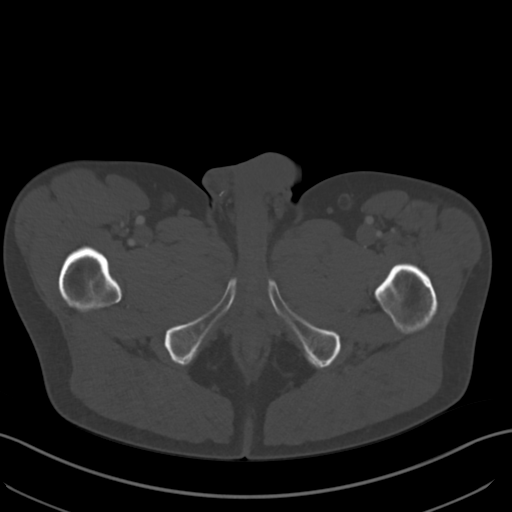
[im 22/130  soft-tissue]
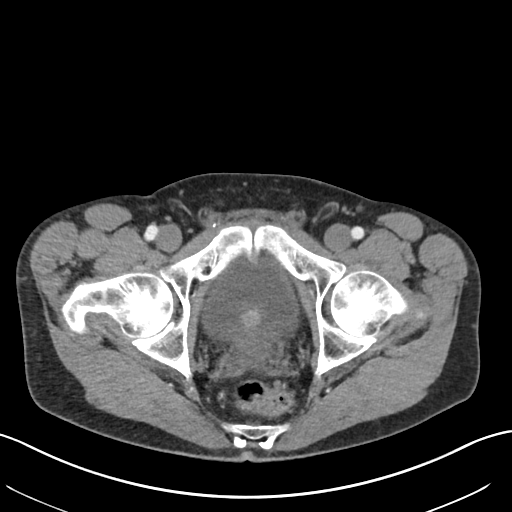
[im 33/130  soft-tissue]
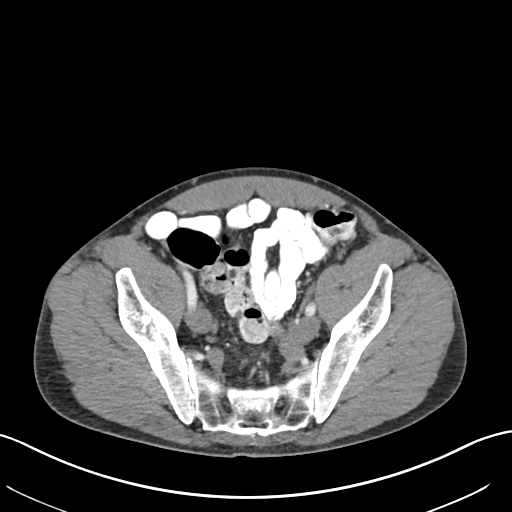
[im 44/130  soft-tissue]
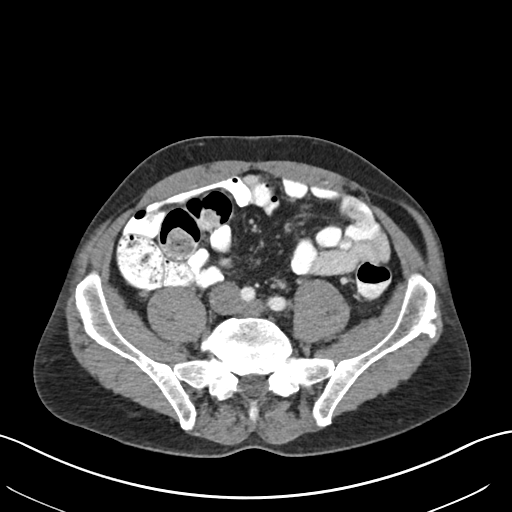
[im 54/130  soft-tissue]
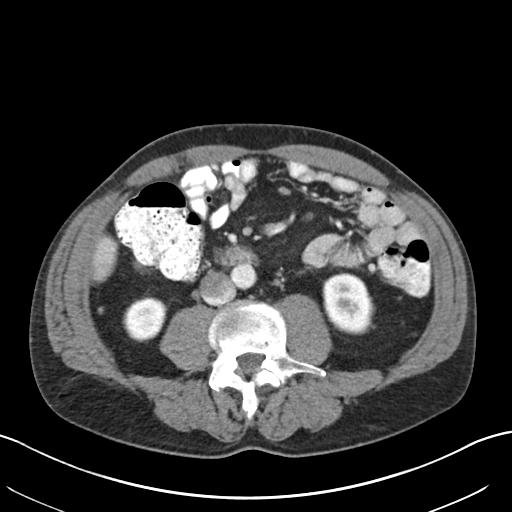
[im 65/130  soft-tissue]
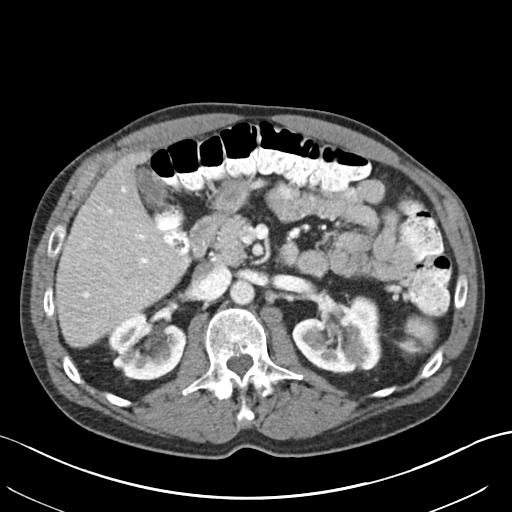
[im 76/130  soft-tissue]
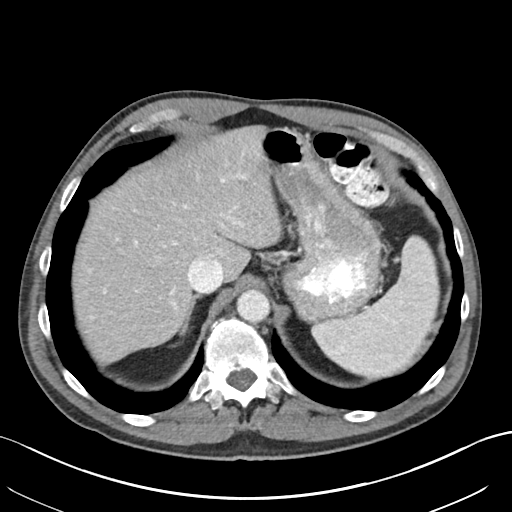
[im 87/130  soft-tissue]
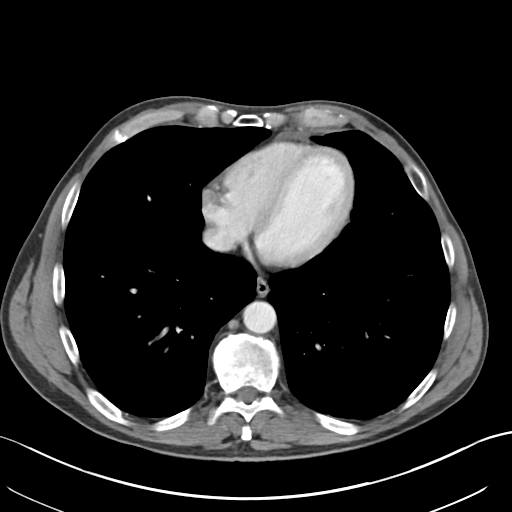
[im 97/130  soft-tissue]
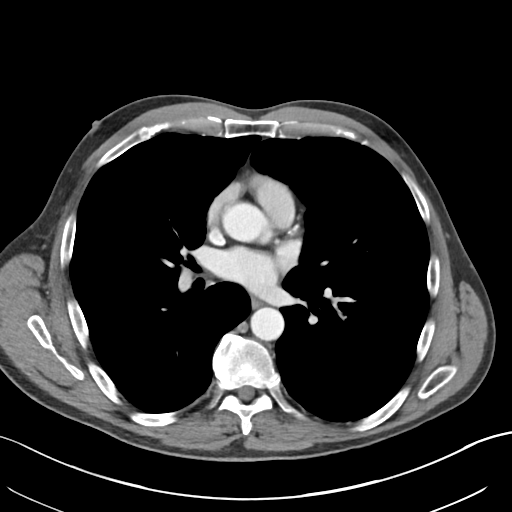
[im 97/130  bone]
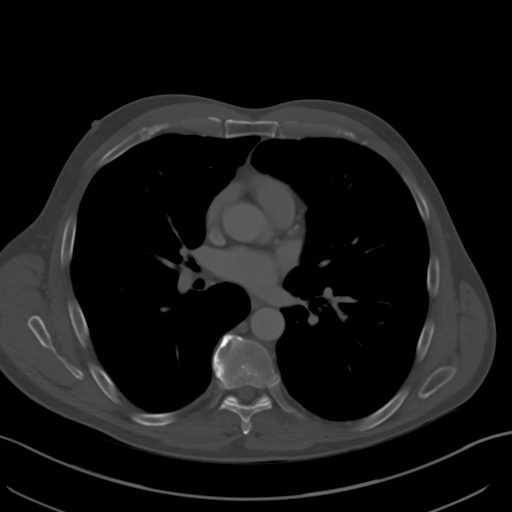
[im 108/130  soft-tissue]
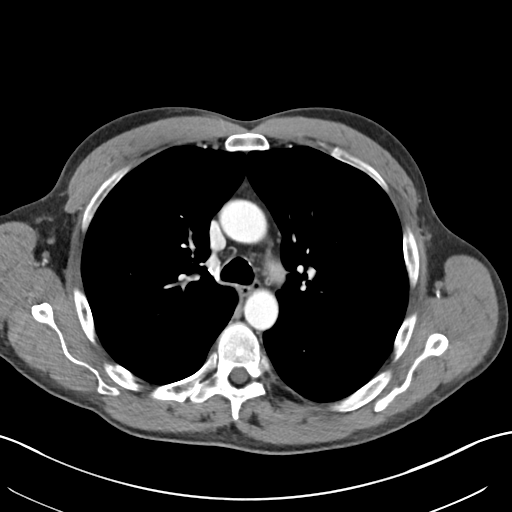
[im 119/130  soft-tissue]
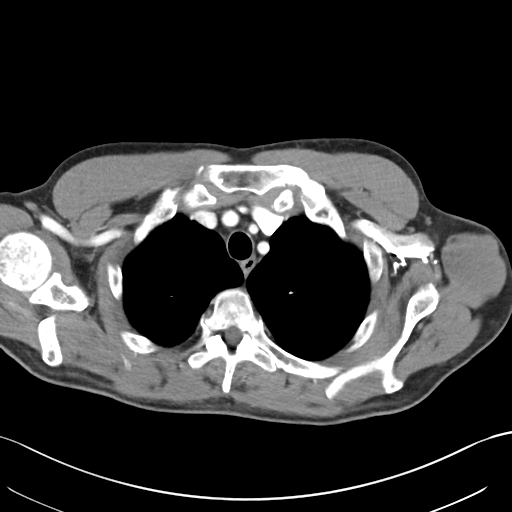

[Series 6: cap with 2.00 br40 s3 cor · coronal · 0.75mm/px · 3 of 192 slices shown]
[im 64/192  soft-tissue]
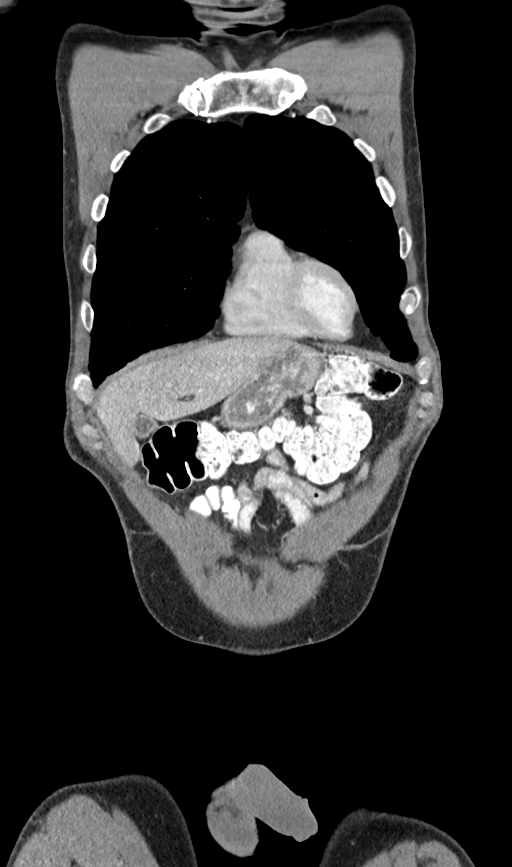
[im 85/192  soft-tissue]
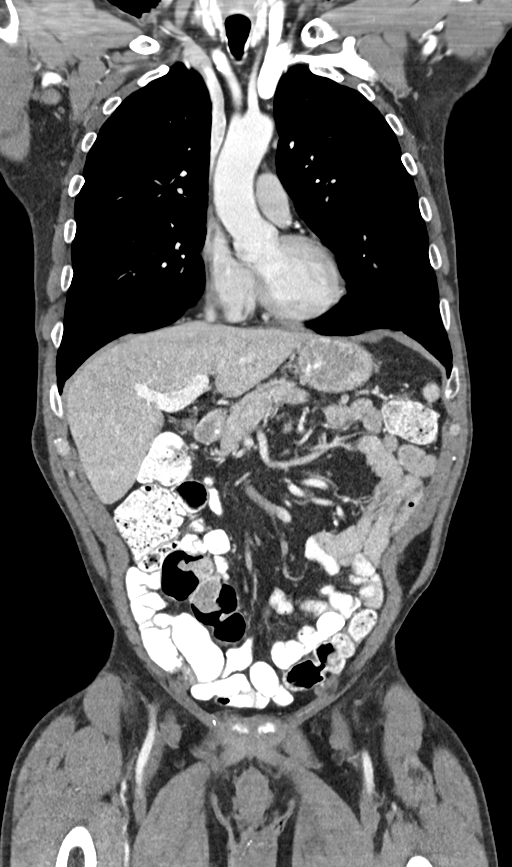
[im 107/192  soft-tissue]
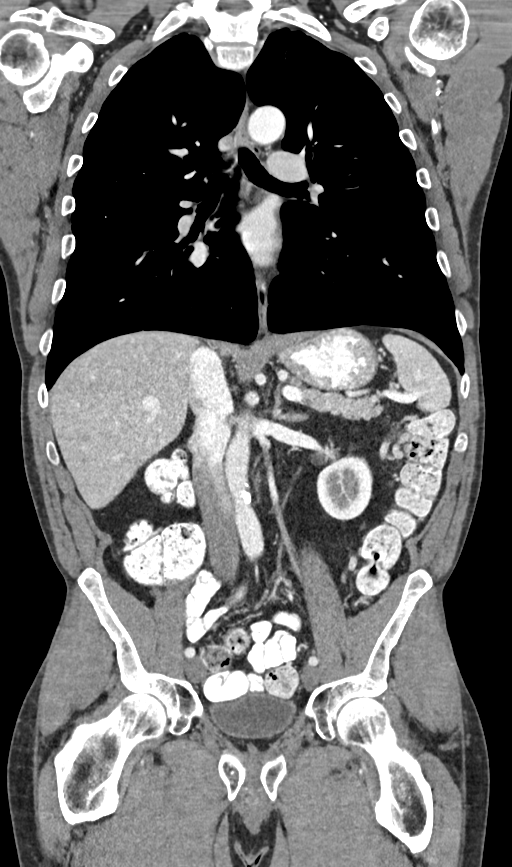

[14 of 46 positions shown; findings below may reference images not displayed]

FINDINGS: CT CHEST FINDINGS

Cardiovascular: Coronary, aortic arch, and branch vessel
atherosclerotic vascular disease.

Mediastinum/Nodes: 1.1 by 0.7 cm hypodense left thyroid nodule,
image 5 series 2. Not clinically significant; no follow-up imaging
recommended (ref: [HOSPITAL]. [DATE]): 143-50).

Lungs/Pleura: Mild biapical pleuroparenchymal scarring.

Musculoskeletal: Degenerative glenohumeral arthropathy bilaterally.
Lower thoracic spondylosis.

CT ABDOMEN PELVIS FINDINGS

Hepatobiliary: Unremarkable

Pancreas: Unremarkable

Spleen: A 1.1 cm enhancing lesion in the spleen on image 64 of
series 2 has some very minimal portal venous phase central
hypodensity. On delayed images this lesion appears to diffusely
enhance for example on image 14 of series 10. Appearance favors a
small splenic hemangioma although remains technically nonspecific.

Adrenals/Urinary Tract: Bilateral renal peripelvic cysts, left
greater than right. Scarring in upper poles of the kidneys, right
greater than left. Adrenal glands unremarkable.

Stomach/Bowel: Localized wall thickening in the lower rectum for
example on image 113 of series 2, this may be from a small collapsed
segment but I cannot exclude a rectal mass.

Vascular/Lymphatic: Aortoiliac atherosclerotic vascular disease. No
pathologic adenopathy identified.

Reproductive: The prostate gland mildly indents the bladder base.

Other: No supplemental non-categorized findings.

Musculoskeletal: Spondylosis and degenerative disc disease cause
foraminal impingement at L4-5 and L5-S1, right greater than left.
IMPRESSION: 1. Focal wall thickening in the lower rectum is nonspecific and
could simply be due to a incidental peristaltic wave, but careful
correlation with the recent or planned colonoscopy with special
attention to the lower rectal wall would be recommended. If this did
turn out to be biopsy positive, then rectal protocol MRI with and
without contrast may be of assistance in detailed staging and
surgical planning.
2. 1.1 cm enhancing lesion of the spleen is technically nonspecific
although probably a hemangioma given the delayed enhancement.
3. Other imaging findings of potential clinical significance: Aortic
Atherosclerosis ([id number]). Coronary atherosclerosis. Mild
chronic scarring in the upper poles of both kidneys. Lumbar
impingement at L4-5 and L5-S1.

## 2023-05-29 DIAGNOSIS — L821 Other seborrheic keratosis: Secondary | ICD-10-CM | POA: Diagnosis not present

## 2023-05-29 DIAGNOSIS — Z85828 Personal history of other malignant neoplasm of skin: Secondary | ICD-10-CM | POA: Diagnosis not present

## 2023-05-29 DIAGNOSIS — D692 Other nonthrombocytopenic purpura: Secondary | ICD-10-CM | POA: Diagnosis not present

## 2023-05-29 DIAGNOSIS — L570 Actinic keratosis: Secondary | ICD-10-CM | POA: Diagnosis not present

## 2023-05-29 DIAGNOSIS — L905 Scar conditions and fibrosis of skin: Secondary | ICD-10-CM | POA: Diagnosis not present

## 2023-05-29 DIAGNOSIS — L814 Other melanin hyperpigmentation: Secondary | ICD-10-CM | POA: Diagnosis not present

## 2023-05-29 DIAGNOSIS — D485 Neoplasm of uncertain behavior of skin: Secondary | ICD-10-CM | POA: Diagnosis not present

## 2023-06-05 DIAGNOSIS — Z6826 Body mass index (BMI) 26.0-26.9, adult: Secondary | ICD-10-CM | POA: Diagnosis not present

## 2023-06-05 DIAGNOSIS — I700 Atherosclerosis of aorta: Secondary | ICD-10-CM | POA: Diagnosis not present

## 2023-06-05 DIAGNOSIS — N401 Enlarged prostate with lower urinary tract symptoms: Secondary | ICD-10-CM | POA: Diagnosis not present

## 2023-06-05 DIAGNOSIS — K219 Gastro-esophageal reflux disease without esophagitis: Secondary | ICD-10-CM | POA: Diagnosis not present

## 2023-06-05 DIAGNOSIS — I2510 Atherosclerotic heart disease of native coronary artery without angina pectoris: Secondary | ICD-10-CM | POA: Diagnosis not present

## 2023-06-05 DIAGNOSIS — N529 Male erectile dysfunction, unspecified: Secondary | ICD-10-CM | POA: Diagnosis not present

## 2023-06-26 DIAGNOSIS — M7989 Other specified soft tissue disorders: Secondary | ICD-10-CM | POA: Diagnosis not present

## 2023-06-26 DIAGNOSIS — Z86718 Personal history of other venous thrombosis and embolism: Secondary | ICD-10-CM | POA: Diagnosis not present

## 2023-06-27 ENCOUNTER — Encounter (HOSPITAL_COMMUNITY): Payer: Self-pay

## 2023-06-27 ENCOUNTER — Ambulatory Visit (HOSPITAL_BASED_OUTPATIENT_CLINIC_OR_DEPARTMENT_OTHER)
Admission: RE | Admit: 2023-06-27 | Discharge: 2023-06-27 | Disposition: A | Discharge status: Home / Self Care | Payer: Medicare HMO | Source: Ambulatory Visit | Attending: Vascular Surgery | Admitting: Vascular Surgery

## 2023-06-27 ENCOUNTER — Ambulatory Visit (HOSPITAL_COMMUNITY)
Admission: RE | Admit: 2023-06-27 | Discharge: 2023-06-27 | Disposition: A | Discharge status: Home / Self Care | Payer: Medicare HMO | Source: Ambulatory Visit | Attending: Vascular Surgery | Admitting: Vascular Surgery

## 2023-06-27 ENCOUNTER — Telehealth (HOSPITAL_COMMUNITY): Payer: Self-pay

## 2023-06-27 ENCOUNTER — Other Ambulatory Visit (HOSPITAL_COMMUNITY): Payer: Self-pay

## 2023-06-27 ENCOUNTER — Other Ambulatory Visit (HOSPITAL_COMMUNITY): Payer: Self-pay | Admitting: Family Medicine

## 2023-06-27 VITALS — BP 138/97 | HR 72

## 2023-06-27 DIAGNOSIS — I82431 Acute embolism and thrombosis of right popliteal vein: Secondary | ICD-10-CM | POA: Insufficient documentation

## 2023-06-27 DIAGNOSIS — R609 Edema, unspecified: Secondary | ICD-10-CM | POA: Insufficient documentation

## 2023-06-27 MED ORDER — APIXABAN (ELIQUIS) VTE STARTER PACK (10MG AND 5MG)
ORAL_TABLET | ORAL | 0 refills | Status: DC
  Filled 2023-06-27: qty 74, 28d supply, fill #0

## 2023-06-27 MED ORDER — APIXABAN 5 MG PO TABS: 5.0000 mg | ORAL_TABLET | Freq: Two times a day (BID) | ORAL | 5 refills | Status: DC
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
## 2023-06-27 NOTE — Patient Instructions (Addendum)
-  Start apixaban (Eliquis) 10 mg twice daily for 7 days followed by 5 mg twice daily. -Your refills have been sent to sent to your [facility] on [address]. You may need to call the pharmacy to ask them to fill this when you start to run low on your current supply.  -It is important to take your medication around the same time every day.  -Avoid NSAIDs like ibuprofen (Advil, Motrin) and naproxen (Aleve) as well as aspirin doses over 100 mg daily. -Tylenol (acetaminophen) is the preferred over the counter pain medication to lower the risk of bleeding. -Be sure to alert all of your health care providers that you are taking an anticoagulant prior to starting a new medication or having a procedure. -Monitor for signs and symptoms of bleeding (abnormal bruising, prolonged bleeding, nose bleeds, bleeding from gums, discolored urine, black tarry stools). If you have fallen and hit your head OR if your bleeding is severe or not stopping, seek emergency care.  -Go to the emergency room if emergent signs and symptoms of new clot occur (new or worse swelling and pain in an arm or leg, shortness of breath, chest pain, fast or irregular heartbeats, lightheadedness, dizziness, fainting, coughing up blood) or if you experience a significant color change (pale or blue) in the extremity that has the DVT.  -We recommend you wear compression stockings (20-30 mmHg) as long as you are having swelling or pain. Be sure to purchase the correct size and take them off at night.   If you have any questions or need to reschedule an appointment, please call [phone] [facility]  If you are having an emergency, call 911 or present to the nearest emergency room.   What is a DVT?  -Deep vein thrombosis (DVT) is a condition in which a blood clot forms in a vein of the deep venous system which can occur in the lower leg, thigh, pelvis, arm, or neck. This condition is serious and can be life-threatening if the clot travels to the  arteries of the lungs and causing a blockage (pulmonary embolism, PE). A DVT can also damage veins in the leg, which can lead to long-term venous disease, leg pain, swelling, discoloration, and ulcers or sores (post-thrombotic syndrome).  -Treatment may include taking an anticoagulant medication to prevent more clots from forming and the current clot from growing, wearing compression stockings, and/or surgical procedures to remove or dissolve the clot.

## 2023-06-27 NOTE — Telephone Encounter (Signed)
Patient Advocate Encounter  Test billing completed for Eliquis and Xarelto.  There is currently $250 remaining on this plans deductible and both medications are returning $297 for 30 days, or $391 for 90 days.  After deductible is met, both medications should have copays of $47 for 30 days or $141 for 90 days.  Burnell Blanks, CPhT Rx Patient Advocate Phone: [phone]

## 2023-06-29 ENCOUNTER — Telehealth (HOSPITAL_COMMUNITY): Payer: Self-pay | Admitting: Student-PharmD

## 2023-06-29 NOTE — Telephone Encounter (Signed)
Patient called to share that he has now taken 4 doses of Eliquis since being seen in DVT Clinic on 06/27/23 and has already seen the swelling and pain in his foot and RLE nearly resolved. He wanted to express his gratitude for being seen so quickly in clinic on Tuesday and for the care he received. Confirmed he is tolerating Eliquis well so far too. He will follow up with hematology as planned and reach out if any questions come up.

## 2023-06-30 DIAGNOSIS — I2510 Atherosclerotic heart disease of native coronary artery without angina pectoris: Secondary | ICD-10-CM | POA: Diagnosis not present

## 2023-06-30 DIAGNOSIS — N401 Enlarged prostate with lower urinary tract symptoms: Secondary | ICD-10-CM | POA: Diagnosis not present

## 2023-07-04 NOTE — Telephone Encounter (Signed)
Received fax from patient's PCP office with lab results from 06/30/23. They are scanned into the media tab.

## 2023-07-19 ENCOUNTER — Telehealth: Payer: Self-pay | Admitting: Hematology and Oncology

## 2023-07-20 ENCOUNTER — Inpatient Hospital Stay: Payer: Medicare HMO | Admitting: Hematology and Oncology

## 2023-07-20 ENCOUNTER — Inpatient Hospital Stay: Payer: Medicare HMO

## 2023-07-27 ENCOUNTER — Inpatient Hospital Stay: Payer: Medicare HMO | Attending: Internal Medicine

## 2023-07-27 ENCOUNTER — Inpatient Hospital Stay (HOSPITAL_BASED_OUTPATIENT_CLINIC_OR_DEPARTMENT_OTHER): Payer: Medicare HMO | Admitting: Hematology and Oncology

## 2023-07-27 ENCOUNTER — Other Ambulatory Visit: Payer: Self-pay | Admitting: Hematology and Oncology

## 2023-07-27 VITALS — BP 133/84 | HR 72 | Temp 98.1°F | Resp 18 | Ht 69.0 in | Wt 182.2 lb

## 2023-07-27 DIAGNOSIS — I82431 Acute embolism and thrombosis of right popliteal vein: Secondary | ICD-10-CM

## 2023-07-27 DIAGNOSIS — K635 Polyp of colon: Secondary | ICD-10-CM | POA: Diagnosis not present

## 2023-07-27 DIAGNOSIS — Z7901 Long term (current) use of anticoagulants: Secondary | ICD-10-CM | POA: Diagnosis not present

## 2023-07-27 LAB — CBC WITH DIFFERENTIAL (CANCER CENTER ONLY)
Abs Immature Granulocytes: 0.03 10*3/uL (ref 0.00–0.07)
Basophils Absolute: 0.1 10*3/uL (ref 0.0–0.1)
Basophils Relative: 1 %
Eosinophils Absolute: 0.1 10*3/uL (ref 0.0–0.5)
Eosinophils Relative: 1 %
HCT: 42.7 % (ref 39.0–52.0)
Hemoglobin: 15 g/dL (ref 13.0–17.0)
Immature Granulocytes: 0 %
Lymphocytes Relative: 11 %
Lymphs Abs: 1 10*3/uL (ref 0.7–4.0)
MCH: 33.2 pg (ref 26.0–34.0)
MCHC: 35.1 g/dL (ref 30.0–36.0)
MCV: 94.5 fL (ref 80.0–100.0)
Monocytes Absolute: 1 10*3/uL (ref 0.1–1.0)
Monocytes Relative: 11 %
Neutro Abs: 6.3 10*3/uL (ref 1.7–7.7)
Neutrophils Relative %: 76 %
Platelet Count: 221 10*3/uL (ref 150–400)
RBC: 4.52 MIL/uL (ref 4.22–5.81)
RDW: 11.8 % (ref 11.5–15.5)
WBC Count: 8.4 10*3/uL (ref 4.0–10.5)
nRBC: 0 % (ref 0.0–0.2)

## 2023-07-27 LAB — CMP (CANCER CENTER ONLY)
ALT: 35 U/L (ref 0–44)
AST: 50 U/L — ABNORMAL HIGH (ref 15–41)
Albumin: 4 g/dL (ref 3.5–5.0)
Alkaline Phosphatase: 109 U/L (ref 38–126)
Anion gap: 6 (ref 5–15)
BUN: 17 mg/dL (ref 8–23)
CO2: 27 mmol/L (ref 22–32)
Calcium: 9.2 mg/dL (ref 8.9–10.3)
Chloride: 105 mmol/L (ref 98–111)
Creatinine: 1.18 mg/dL (ref 0.61–1.24)
GFR, Estimated: >60 mL/min (ref >60)
Glucose, Bld: 99 mg/dL (ref 70–99)
Potassium: 4.2 mmol/L (ref 3.5–5.1)
Sodium: 138 mmol/L (ref 135–145)
Total Bilirubin: 0.9 mg/dL (ref 0.0–1.2)
Total Protein: 6.3 g/dL — ABNORMAL LOW (ref 6.5–8.1)

## 2023-07-27 NOTE — Progress Notes (Signed)
 [facility] Telephone:([phone]) [phone]   Fax:([phone]  PROGRESS NOTE  Patient Care Team: Jarrett Soho, PA-C as PCP - General (Family Medicine)  Hematological/Oncological History # Unprovoked RLE DVT 02/18/2020: US revealed an acute occlusive DVT involving the right profundal femoral vein extending into the popliteal and calf veins below the knee. Started on Xarelto therapy.  11/27/2020: establish care with Dr. Leonides Schanz. Lost to follow up 06/27/2023: RLE US showed an age indeterminate deep vein thrombosis involving the right popliteal vein.    # Colon Polyp Mass 11/01/2020: robotic assisted segmental colectomy. Pathology revealed a tublovillous adenoma, 4.1 cm negative for high grade dysplasia or carcinoma. Thirteen lymph nodes were benign.   Interval History:  Darius Andrade 68 y.o. male with medical history significant for unprovoked RLE DVT who presents for a follow up visit. The patient's last visit was on 11/27/2020. In the interim since the last visit he was lost to follow up.   On exam today Darius Andrade reports he did stop the Xarelto after our last visit and transition to baby aspirin which he only continued for about a month.  He then subsequently discontinued all anticoagulation.  He reports unfortunately he developed swelling in his foot and was sore down his leg.  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  He reports that he is tolerating his Eliquis 5 mg twice daily therapy well with no bleeding, bruising, or dark stools.  He reports he likes it more than he likes to Xarelto as there was more bruising with the Xarelto.  He notes that he is had no recent infectious symptoms such as fevers, chills, sweats, nausea, vomiting or diarrhea.  He reports that he is also not having any trouble with the medication cost which is $47 per month.  He reports that he has been working out well and eating plenty of veggies and fibrous foods.  He notes that the swelling in his  leg has improved but the right leg still remains larger than the left leg.  Otherwise he has no questions or concerns today.  MEDICAL HISTORY:  Past Medical History:  Diagnosis Date   Arthritis    Coronary artery disease    Seen on CT scan    Enlarged prostate    GERD (gastroesophageal reflux disease)     SURGICAL HISTORY: Past Surgical History:  Procedure Laterality Date   Hemrrhoid      SOCIAL HISTORY: Social History   Socioeconomic History   Marital status: Legally Separated    Spouse name: Not on file   Number of children: Not on file   Years of education: Not on file   Highest education level: Not on file  Occupational History   Not on file  Tobacco Use   Smoking status: Former    Types: Cigarettes   Smokeless tobacco: Never   Tobacco comments:    quit 2005  Vaping Use   Vaping status: Never Used  Substance and Sexual Activity   Alcohol use: Never   Drug use: Never   Sexual activity: Not Currently  Other Topics Concern   Not on file  Social History Narrative   Not on file   Social Drivers of Health   Financial Resource Strain: Not on file  Food Insecurity: Not on file  Transportation Needs: Not on file  Physical Activity: Not on file  Stress: Not on file  Social Connections: Unknown (10/09/2021)   [facility]   Social Network  Social Network: Not on file  Intimate Partner Violence: Unknown (09/01/2021)   Received from [facility]   HITS    Physically Hurt: Not on file    Insult or Talk Down To: Not on file    Threaten Physical Harm: Not on file    Scream or Curse: Not on file    FAMILY HISTORY: No family history on file.  ALLERGIES:  has no known allergies.  MEDICATIONS:  Current Outpatient Medications  Medication Sig Dispense Refill   apixaban (ELIQUIS) 5 MG TABS tablet Take 1 tablet (5 mg total) by mouth 2 (two) times daily. Start taking after completion of starter pack. 60 tablet 5   APIXABAN (ELIQUIS) VTE STARTER  PACK (10MG  AND 5MG ) Take as directed on package: start with two-5mg  tablets twice daily for 7 days. On day 8, switch to one-5mg  tablet twice daily. 74 each 0   pantoprazole (PROTONIX) 40 MG tablet Take 40 mg by mouth 2 (two) times daily.     tadalafil (CIALIS) 10 MG tablet Take 10 mg by mouth daily as needed for erectile dysfunction.     tamsulosin (FLOMAX) 0.4 MG CAPS capsule Take 0.4 mg by mouth daily.     No current facility-administered medications for this visit.    REVIEW OF SYSTEMS:   Constitutional: ( - ) fevers, ( - )  chills , ( - ) night sweats Eyes: ( - ) blurriness of vision, ( - ) double vision, ( - ) watery eyes Ears, nose, mouth, throat, and face: ( - ) mucositis, ( - ) sore throat Respiratory: ( - ) cough, ( - ) dyspnea, ( - ) wheezes Cardiovascular: ( - ) palpitation, ( - ) chest discomfort, ( - ) lower extremity swelling Gastrointestinal:  ( - ) nausea, ( - ) heartburn, ( - ) change in bowel habits Skin: ( - ) abnormal skin rashes Lymphatics: ( - ) new lymphadenopathy, ( - ) easy bruising Neurological: ( - ) numbness, ( - ) tingling, ( - ) new weaknesses Behavioral/Psych: ( - ) mood change, ( - ) new changes  All other systems were reviewed with the patient and are negative.  PHYSICAL EXAMINATION:  Vitals:   07/27/23 1451  BP: 133/84  Pulse: 72  Resp: 18  Temp: 98.1 F (36.7 C)  SpO2: 97%   Filed Weights   07/27/23 1451  Weight: 182 lb 3.2 oz (82.6 kg)    GENERAL: Well-appearing middle-age Caucasian male, alert, no distress and comfortable SKIN: skin color, texture, turgor are normal, no rashes or significant lesions EYES: conjunctiva are pink and non-injected, sclera clear LUNGS: clear to auscultation and percussion with normal breathing effort HEART: regular rate & rhythm and no murmurs and no lower extremity edema Musculoskeletal: no cyanosis of digits and no clubbing  PSYCH: alert & oriented x 3, fluent speech NEURO: no focal motor/sensory  deficits  LABORATORY DATA:  I have reviewed the data as listed    Latest Ref Rng & Units 07/27/2023    2:35 PM 11/27/2020   12:39 PM 10/31/2020    3:55 AM  CBC  WBC 4.0 - 10.5 K/uL 8.4  7.4  14.5   Hemoglobin 13.0 - 17.0 g/dL 19.1  47.8  29.5   Hematocrit 39.0 - 52.0 % 42.7  42.0  43.2   Platelets 150 - 400 K/uL 221  270  220        Latest Ref Rng & Units 07/27/2023    2:35 PM 11/27/2020  12:39 PM 10/31/2020    3:55 AM  CMP  Glucose 70 - 99 mg/dL 99  89  628   BUN 8 - 23 mg/dL 17  16  11    Creatinine 0.61 - 1.24 mg/dL 3.15  1.76  1.60   Sodium 135 - 145 mmol/L 138  139  134   Potassium 3.5 - 5.1 mmol/L 4.2  4.4  4.0   Chloride 98 - 111 mmol/L 105  107  98   CO2 22 - 32 mmol/L 27  24  26    Calcium 8.9 - 10.3 mg/dL 9.2  8.9  9.1   Total Protein 6.5 - 8.1 g/dL 6.3  5.9    Total Bilirubin 0.0 - 1.2 mg/dL 0.9  0.6    Alkaline Phos 38 - 126 U/L 109  62    AST 15 - 41 U/L 50  14    ALT 0 - 44 U/L 35  19     RADIOGRAPHIC STUDIES: I have personally reviewed the radiological images as listed and agreed with the findings in the report. No results found.   ASSESSMENT & PLAN Darius Andrade 68 y.o. male with medical history significant for unprovoked RLE DVT who presents for a follow up visit.  After review of the labs, review of the records, and discussion with the patient the patients findings are most consistent with an unprovoked right lower extremity DVT.  Given these findings recommendation will be for lifelong anticoagulation given the fact that this was above the knee and has no clear explanation.  The patient is currently on Xarelto 20 mg p.o. daily and would have the option to drop down to Xarelto 10 mg as maintenance dosing.  The patient voices understanding of this plan moving forward.   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  Therefore the recommendation would be for indefinite  anticoagulation --After self discontinuation of anticoagulation the patient developed a recurrent VTE.  Reemphasized that indefinite anticoagulation is recommended --continue Eliquis 5 mg BID. Can transition to maintenance dose after 6 months on this therapy.  --labs today show WBC 8.4, Hgb 15.0, MCV 94.5, Plt 221.  --RTC in 6 months time to assess   No orders of the defined types were placed in this encounter.   All questions were answered. The patient knows to call the clinic with any problems, questions or concerns.  A total of more than 40 minutes were spent on this encounter with face-to-face time and non-face-to-face time, including preparing to see the patient, ordering tests and/or medications, counseling the patient and coordination of care as outlined above.   Ulysees Barns, MD Department of Hematology/Oncology [facility] at [facility] Phone: [phone] Pager: [phone] Email: [email or web address]  07/29/2023 3:15 PM

## 2023-12-28 ENCOUNTER — Inpatient Hospital Stay: Payer: Medicare HMO | Admitting: Hematology and Oncology

## 2023-12-28 ENCOUNTER — Inpatient Hospital Stay: Payer: Medicare HMO | Attending: Family Medicine

## 2023-12-28 ENCOUNTER — Other Ambulatory Visit: Payer: Self-pay | Admitting: Hematology and Oncology

## 2023-12-28 DIAGNOSIS — I82431 Acute embolism and thrombosis of right popliteal vein: Secondary | ICD-10-CM

## 2023-12-28 NOTE — Progress Notes (Signed)
 No show

## 2024-02-01 ENCOUNTER — Inpatient Hospital Stay: Attending: Family Medicine

## 2024-02-01 ENCOUNTER — Telehealth: Payer: Self-pay | Admitting: *Deleted

## 2024-02-01 ENCOUNTER — Inpatient Hospital Stay: Admitting: Hematology and Oncology

## 2024-02-01 VITALS — BP 139/88 | HR 78 | Temp 97.6°F | Resp 16 | Ht 70.0 in | Wt 186.7 lb

## 2024-02-01 DIAGNOSIS — Z87891 Personal history of nicotine dependence: Secondary | ICD-10-CM | POA: Diagnosis not present

## 2024-02-01 DIAGNOSIS — Z7901 Long term (current) use of anticoagulants: Secondary | ICD-10-CM | POA: Insufficient documentation

## 2024-02-01 DIAGNOSIS — Z860101 Personal history of adenomatous and serrated colon polyps: Secondary | ICD-10-CM | POA: Diagnosis not present

## 2024-02-01 DIAGNOSIS — I82431 Acute embolism and thrombosis of right popliteal vein: Secondary | ICD-10-CM

## 2024-02-01 DIAGNOSIS — Z86718 Personal history of other venous thrombosis and embolism: Secondary | ICD-10-CM | POA: Insufficient documentation

## 2024-02-01 LAB — CMP (CANCER CENTER ONLY)
ALT: 23 U/L (ref 0–44)
AST: 23 U/L (ref 15–41)
Albumin: 4 g/dL (ref 3.5–5.0)
Alkaline Phosphatase: 72 U/L (ref 38–126)
Anion gap: 7 (ref 5–15)
BUN: 18 mg/dL (ref 8–23)
CO2: 25 mmol/L (ref 22–32)
Calcium: 9.1 mg/dL (ref 8.9–10.3)
Chloride: 105 mmol/L (ref 98–111)
Creatinine: 1.11 mg/dL (ref 0.61–1.24)
GFR, Estimated: >60 mL/min
Glucose, Bld: 108 mg/dL — ABNORMAL HIGH (ref 70–99)
Potassium: 3.8 mmol/L (ref 3.5–5.1)
Sodium: 137 mmol/L (ref 135–145)
Total Bilirubin: 0.4 mg/dL (ref 0.0–1.2)
Total Protein: 6.3 g/dL — ABNORMAL LOW (ref 6.5–8.1)

## 2024-02-01 LAB — CBC WITH DIFFERENTIAL (CANCER CENTER ONLY)
Abs Immature Granulocytes: 0.02 K/uL (ref 0.00–0.07)
Basophils Absolute: 0.1 K/uL (ref 0.0–0.1)
Basophils Relative: 1 %
Eosinophils Absolute: 0.1 K/uL (ref 0.0–0.5)
Eosinophils Relative: 2 %
HCT: 43.5 % (ref 39.0–52.0)
Hemoglobin: 15.5 g/dL (ref 13.0–17.0)
Immature Granulocytes: 0 %
Lymphocytes Relative: 17 %
Lymphs Abs: 1.3 K/uL (ref 0.7–4.0)
MCH: 33.3 pg (ref 26.0–34.0)
MCHC: 35.6 g/dL (ref 30.0–36.0)
MCV: 93.3 fL (ref 80.0–100.0)
Monocytes Absolute: 0.7 K/uL (ref 0.1–1.0)
Monocytes Relative: 9 %
Neutro Abs: 5.7 K/uL (ref 1.7–7.7)
Neutrophils Relative %: 71 %
Platelet Count: 210 K/uL (ref 150–400)
RBC: 4.66 MIL/uL (ref 4.22–5.81)
RDW: 11.9 % (ref 11.5–15.5)
WBC Count: 8 K/uL (ref 4.0–10.5)
nRBC: 0 % (ref 0.0–0.2)

## 2024-02-01 MED ORDER — APIXABAN 2.5 MG PO TABS: 2.5000 mg | ORAL_TABLET | Freq: Two times a day (BID) | ORAL | 3 refills | Status: AC

## 2024-02-01 NOTE — Telephone Encounter (Signed)
 TCT patient regarding recent lab results. Spoke with him. Advised that his LFTs have returned to normal. All labs WNL. Pt very pleased with these results. No further questions or concerns.

## 2024-02-01 NOTE — Progress Notes (Signed)
 [facility] Telephone:([phone]) [phone]   Fax:(336) [phone]  PROGRESS NOTE  Patient Care Team: Katina Pfeiffer, PA-C as PCP - General (Family Medicine)  Hematological/Oncological History # Unprovoked RLE DVT 02/18/2020: US  revealed an acute occlusive DVT involving the right profundal femoral vein extending into the popliteal and calf veins below the knee. Started on Xarelto  therapy.  11/27/2020: establish care with Dr. Federico. Lost to follow up 06/27/2023: RLE US  showed an age indeterminate deep vein thrombosis involving the right popliteal vein.    # Colon Polyp Mass 11/01/2020: robotic assisted segmental colectomy. Pathology revealed a tublovillous adenoma, 4.1 cm negative for high grade dysplasia or carcinoma. Thirteen lymph nodes were benign.   Interval History:  Darius Andrade 68 y.o. male with medical history significant for unprovoked RLE DVT who presents for a follow up visit. The patient's last visit was on 07/27/2023. In the interim since the last visit he was lost to follow up.   On exam today Darius Andrade reports he has been well overall in the room since her last visit.  He reports he is faithfully taking his Eliquis  5 mg twice daily without difficulty.  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  He has been up at [facility] pressure washing and says the residents love it.  He notes that he has not been having any trouble with bleeding, bruising, or dark stools.  He reports that his appetite and energy are both very strong.  He reports he Eliquis  is costing him $47 per month.  He reports he is not having any signs or symptoms concerning for recurrent VTE such as leg pain, leg swelling, chest pain, or shortness of breath.  He has no upcoming major surgeries or dental work but does have a standard dental cleaning next week.  Overall he feels well and denies any fevers, chills, sweats, nausea, vomiting or diarrhea.   Full 10 point ROS is otherwise negative.  MEDICAL HISTORY:  Past Medical History:  Diagnosis Date   Arthritis    Coronary artery disease    Seen on CT scan    Enlarged prostate    GERD (gastroesophageal reflux disease)     SURGICAL HISTORY: Past Surgical History:  Procedure Laterality Date   Hemrrhoid      SOCIAL HISTORY: Social History   Socioeconomic History   Marital status: Legally Separated    Spouse name: Not on file   Number of children: Not on file   Years of education: Not on file   Highest education level: Not on file  Occupational History   Not on file  Tobacco Use   Smoking status: Former    Types: Cigarettes   Smokeless tobacco: Never   Tobacco comments:    quit 2005  Vaping Use   Vaping status: Never Used  Substance and Sexual Activity   Alcohol use: Never   Drug use: Never   Sexual activity: Not Currently  Other Topics Concern   Not on file  Social History Narrative   Not on file   Social Drivers of Health   Financial Resource Strain: Not on file  Food Insecurity: Not on file  Transportation Needs: Not on file  Physical Activity: Not on file  Stress: Not on file  Social Connections: Unknown (10/09/2021)   Received from [facility]   Social Network    Social Network: Not on file  Intimate Partner Violence: Unknown (09/01/2021)   Received from [facility]   HITS    Physically Hurt: Not on file    Insult or Talk Down To: Not on file    Threaten Physical Harm: Not on file    Scream or Curse: Not on file    FAMILY HISTORY: No family history on file.  ALLERGIES:  has no known allergies.  MEDICATIONS:  Current Outpatient Medications  Medication Sig Dispense Refill   apixaban  (ELIQUIS ) 2.5 MG TABS tablet Take 1 tablet (2.5 mg total) by mouth 2 (two) times daily. 180 tablet 3   pantoprazole (PROTONIX) 40 MG tablet Take 40 mg by mouth 2 (two) times daily.     tadalafil (CIALIS) 10 MG tablet Take 10 mg by mouth daily as needed for  erectile dysfunction.     tamsulosin  (FLOMAX ) 0.4 MG CAPS capsule Take 0.4 mg by mouth daily.     No current facility-administered medications for this visit.    REVIEW OF SYSTEMS:   Constitutional: ( - ) fevers, ( - )  chills , ( - ) night sweats Eyes: ( - ) blurriness of vision, ( - ) double vision, ( - ) watery eyes Ears, nose, mouth, throat, and face: ( - ) mucositis, ( - ) sore throat Respiratory: ( - ) cough, ( - ) dyspnea, ( - ) wheezes Cardiovascular: ( - ) palpitation, ( - ) chest discomfort, ( - ) lower extremity swelling Gastrointestinal:  ( - ) nausea, ( - ) heartburn, ( - ) change in bowel habits Skin: ( - ) abnormal skin rashes Lymphatics: ( - ) new lymphadenopathy, ( - ) easy bruising Neurological: ( - ) numbness, ( - ) tingling, ( - ) new weaknesses Behavioral/Psych: ( - ) mood change, ( - ) new changes  All other systems were reviewed with the patient and are negative.  PHYSICAL EXAMINATION:  Vitals:   02/01/24 1445  BP: 139/88  Pulse: 78  Resp: 16  Temp: 97.6 F (36.4 C)  SpO2: 98%    Filed Weights   02/01/24 1445  Weight: 186 lb 11.2 oz (84.7 kg)     GENERAL: Well-appearing middle-age Caucasian male, alert, no distress and comfortable SKIN: skin color, texture, turgor are normal, no rashes or significant lesions EYES: conjunctiva are pink and non-injected, sclera clear LUNGS: clear to auscultation and percussion with normal breathing effort HEART: regular rate & rhythm and no murmurs and no lower extremity edema Musculoskeletal: no cyanosis of digits and no clubbing  PSYCH: alert & oriented x 3, fluent speech NEURO: no focal motor/sensory deficits  LABORATORY DATA:  I have reviewed the data as listed    Latest Ref Rng & Units 02/01/2024    2:27 PM 07/27/2023    2:35 PM 11/27/2020   12:39 PM  CBC  WBC 4.0 - 10.5 K/uL 8.0  8.4  7.4   Hemoglobin 13.0 - 17.0 g/dL 84.4  84.9  85.2   Hematocrit 39.0 - 52.0 % 43.5  42.7  42.0   Platelets 150 - 400 K/uL  210  221  270        Latest Ref Rng & Units 02/01/2024    2:27 PM 07/27/2023    2:35 PM 11/27/2020   12:39 PM  CMP  Glucose 70 - 99 mg/dL 891  99  89   BUN 8 - 23 mg/dL 18  17  16    Creatinine 0.61 - 1.24 mg/dL 8.88  8.81  8.99   Sodium 135 - 145 mmol/L 137  138  139  Potassium 3.5 - 5.1 mmol/L 3.8  4.2  4.4   Chloride 98 - 111 mmol/L 105  105  107   CO2 22 - 32 mmol/L 25  27  24    Calcium 8.9 - 10.3 mg/dL 9.1  9.2  8.9   Total Protein 6.5 - 8.1 g/dL 6.3  6.3  5.9   Total Bilirubin 0.0 - 1.2 mg/dL 0.4  0.9  0.6   Alkaline Phos 38 - 126 U/L 72  109  62   AST 15 - 41 U/L 23  50  14   ALT 0 - 44 U/L 23  35  19    RADIOGRAPHIC STUDIES: I have personally reviewed the radiological images as listed and agreed with the findings in the report. No results found.   ASSESSMENT & PLAN TAILOR [place]ST[place]ALL 68 y.o. male with medical history significant for unprovoked RLE DVT who presents for a follow up visit.  After review of the labs, review of the records, and discussion with the patient the patients findings are most consistent with an unprovoked right lower extremity DVT.  Given these findings recommendation will be for lifelong anticoagulation given the fact that this was above the knee and has no clear explanation.  The patient is currently on Xarelto  20 mg p.o. daily and would have the option to drop down to Xarelto  10 mg as maintenance dosing.  The patient voices understanding of this plan moving forward.   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  Therefore the recommendation would be for indefinite anticoagulation --After self discontinuation of anticoagulation the patient developed a recurrent VTE.  Reemphasized that indefinite anticoagulation is recommended --continue Eliquis  5 mg BID.  Patient agreeable to transitioning to maintenance dose 2.5 mg twice daily.  Will call this in for his next refill. --labs today  show WBC 8.0, Hgb 15.5, MCV 93.3, Plt 210  --RTC in 6 months time to assess   No orders of the defined types were placed in this encounter.   All questions were answered. The patient knows to call the clinic with any problems, questions or concerns.  A total of more than 30 minutes were spent on this encounter with face-to-face time and non-face-to-face time, including preparing to see the patient, ordering tests and/or medications, counseling the patient and coordination of care as outlined above.   Darius IVAR Kidney, MD Department of Hematology/Oncology [facility] at [facility] Phone: [phone] Pager: [phone] Email: [email or web address]  02/01/2024 3:14 PM

## 2024-02-27 DIAGNOSIS — Z85828 Personal history of other malignant neoplasm of skin: Secondary | ICD-10-CM | POA: Diagnosis not present

## 2024-02-27 DIAGNOSIS — L905 Scar conditions and fibrosis of skin: Secondary | ICD-10-CM | POA: Diagnosis not present

## 2024-02-27 DIAGNOSIS — L821 Other seborrheic keratosis: Secondary | ICD-10-CM | POA: Diagnosis not present

## 2024-02-27 DIAGNOSIS — L570 Actinic keratosis: Secondary | ICD-10-CM | POA: Diagnosis not present

## 2024-02-27 DIAGNOSIS — L814 Other melanin hyperpigmentation: Secondary | ICD-10-CM | POA: Diagnosis not present

## 2024-02-27 DIAGNOSIS — D225 Melanocytic nevi of trunk: Secondary | ICD-10-CM | POA: Diagnosis not present

## 2024-03-28 DIAGNOSIS — R143 Flatulence: Secondary | ICD-10-CM | POA: Diagnosis not present

## 2024-03-28 DIAGNOSIS — K5900 Constipation, unspecified: Secondary | ICD-10-CM | POA: Diagnosis not present

## 2024-03-28 DIAGNOSIS — R059 Cough, unspecified: Secondary | ICD-10-CM | POA: Diagnosis not present

## 2024-03-28 DIAGNOSIS — R1012 Left upper quadrant pain: Secondary | ICD-10-CM | POA: Diagnosis not present

## 2024-03-28 DIAGNOSIS — K219 Gastro-esophageal reflux disease without esophagitis: Secondary | ICD-10-CM | POA: Diagnosis not present

## 2024-03-28 DIAGNOSIS — R0982 Postnasal drip: Secondary | ICD-10-CM | POA: Diagnosis not present

## 2024-05-06 DIAGNOSIS — N401 Enlarged prostate with lower urinary tract symptoms: Secondary | ICD-10-CM | POA: Diagnosis not present

## 2024-05-06 DIAGNOSIS — N5201 Erectile dysfunction due to arterial insufficiency: Secondary | ICD-10-CM | POA: Diagnosis not present

## 2024-05-06 DIAGNOSIS — M25511 Pain in right shoulder: Secondary | ICD-10-CM | POA: Diagnosis not present

## 2024-05-06 DIAGNOSIS — R350 Frequency of micturition: Secondary | ICD-10-CM | POA: Diagnosis not present

## 2024-05-06 DIAGNOSIS — R3912 Poor urinary stream: Secondary | ICD-10-CM | POA: Diagnosis not present

## 2024-05-06 DIAGNOSIS — R3915 Urgency of urination: Secondary | ICD-10-CM | POA: Diagnosis not present

## 2024-07-31 ENCOUNTER — Other Ambulatory Visit

## 2024-07-31 ENCOUNTER — Ambulatory Visit: Admitting: Hematology and Oncology

## 2024-08-01 ENCOUNTER — Other Ambulatory Visit

## 2024-08-01 ENCOUNTER — Ambulatory Visit: Admitting: Hematology and Oncology
